# Patient Record
Sex: Male | Born: 1965 | Race: Black or African American | Hispanic: No | Marital: Married | Smoking: Never smoker
Health system: Southern US, Community
[De-identification: ages and names within clinical notes are randomized; demographics above are authoritative.]

## PROBLEM LIST (undated history)

## (undated) DIAGNOSIS — T7840XS Allergy, unspecified, sequela: Secondary | ICD-10-CM

## (undated) DIAGNOSIS — D721 Eosinophilia, unspecified: Secondary | ICD-10-CM

## (undated) DIAGNOSIS — I10 Essential (primary) hypertension: Secondary | ICD-10-CM

## (undated) DIAGNOSIS — K4090 Unilateral inguinal hernia, without obstruction or gangrene, not specified as recurrent: Secondary | ICD-10-CM

## (undated) HISTORY — DX: Unilateral inguinal hernia, without obstruction or gangrene, not specified as recurrent: K40.90

## (undated) HISTORY — PX: HERNIA REPAIR: SHX51

## (undated) HISTORY — DX: Allergy, unspecified, sequela: T78.40XS

## (undated) HISTORY — DX: Eosinophilia, unspecified: D72.10

---

## 1997-10-10 ENCOUNTER — Emergency Department (HOSPITAL_COMMUNITY): Admission: EM | Admit: 1997-10-10 | Discharge: 1997-10-10 | Payer: Self-pay | Admitting: Emergency Medicine

## 2006-03-03 ENCOUNTER — Emergency Department (HOSPITAL_COMMUNITY): Admission: EM | Admit: 2006-03-03 | Discharge: 2006-03-03 | Payer: Self-pay | Admitting: Emergency Medicine

## 2006-04-04 ENCOUNTER — Ambulatory Visit (HOSPITAL_COMMUNITY): Admission: RE | Admit: 2006-04-04 | Discharge: 2006-04-04 | Payer: Self-pay | Admitting: Internal Medicine

## 2016-04-20 ENCOUNTER — Emergency Department (HOSPITAL_COMMUNITY)
Admission: EM | Admit: 2016-04-20 | Discharge: 2016-04-20 | Disposition: A | Discharge status: Home / Self Care | Payer: 59 | Attending: Emergency Medicine | Admitting: Emergency Medicine

## 2016-04-20 ENCOUNTER — Encounter (HOSPITAL_COMMUNITY): Payer: Self-pay | Admitting: Emergency Medicine

## 2016-04-20 ENCOUNTER — Emergency Department (HOSPITAL_COMMUNITY): Payer: 59

## 2016-04-20 DIAGNOSIS — X58XXXA Exposure to other specified factors, initial encounter: Secondary | ICD-10-CM | POA: Diagnosis not present

## 2016-04-20 DIAGNOSIS — M545 Low back pain, unspecified: Secondary | ICD-10-CM

## 2016-04-20 DIAGNOSIS — R935 Abnormal findings on diagnostic imaging of other abdominal regions, including retroperitoneum: Secondary | ICD-10-CM | POA: Insufficient documentation

## 2016-04-20 DIAGNOSIS — Y9389 Activity, other specified: Secondary | ICD-10-CM | POA: Insufficient documentation

## 2016-04-20 DIAGNOSIS — I10 Essential (primary) hypertension: Secondary | ICD-10-CM | POA: Diagnosis not present

## 2016-04-20 DIAGNOSIS — R55 Syncope and collapse: Secondary | ICD-10-CM | POA: Diagnosis not present

## 2016-04-20 DIAGNOSIS — Z79899 Other long term (current) drug therapy: Secondary | ICD-10-CM | POA: Insufficient documentation

## 2016-04-20 DIAGNOSIS — S3992XA Unspecified injury of lower back, initial encounter: Secondary | ICD-10-CM | POA: Diagnosis present

## 2016-04-20 DIAGNOSIS — Z7982 Long term (current) use of aspirin: Secondary | ICD-10-CM | POA: Insufficient documentation

## 2016-04-20 DIAGNOSIS — Y92009 Unspecified place in unspecified non-institutional (private) residence as the place of occurrence of the external cause: Secondary | ICD-10-CM | POA: Insufficient documentation

## 2016-04-20 DIAGNOSIS — Y999 Unspecified external cause status: Secondary | ICD-10-CM | POA: Diagnosis not present

## 2016-04-20 DIAGNOSIS — S39012A Strain of muscle, fascia and tendon of lower back, initial encounter: Secondary | ICD-10-CM | POA: Diagnosis not present

## 2016-04-20 HISTORY — DX: Essential (primary) hypertension: I10

## 2016-04-20 LAB — URINALYSIS, ROUTINE W REFLEX MICROSCOPIC
Bilirubin Urine: NEGATIVE
Glucose, UA: NEGATIVE mg/dL
HGB URINE DIPSTICK: NEGATIVE
KETONES UR: NEGATIVE mg/dL
Leukocytes, UA: NEGATIVE
Nitrite: NEGATIVE
PROTEIN: NEGATIVE mg/dL
Specific Gravity, Urine: 1.018 (ref 1.005–1.030)
pH: 6 (ref 5.0–8.0)

## 2016-04-20 LAB — BASIC METABOLIC PANEL
Anion gap: 8 (ref 5–15)
BUN: 9 mg/dL (ref 6–20)
CALCIUM: 9.3 mg/dL (ref 8.9–10.3)
CO2: 28 mmol/L (ref 22–32)
CREATININE: 0.94 mg/dL (ref 0.61–1.24)
Chloride: 103 mmol/L (ref 101–111)
GFR calc Af Amer: >60 mL/min (ref >60)
GLUCOSE: 112 mg/dL — AB (ref 65–99)
POTASSIUM: 4.2 mmol/L (ref 3.5–5.1)
SODIUM: 139 mmol/L (ref 135–145)

## 2016-04-20 LAB — CBC
HEMATOCRIT: 43.8 % (ref 39.0–52.0)
Hemoglobin: 14.6 g/dL (ref 13.0–17.0)
MCH: 26.2 pg (ref 26.0–34.0)
MCHC: 33.3 g/dL (ref 30.0–36.0)
MCV: 78.6 fL (ref 78.0–100.0)
PLATELETS: 168 10*3/uL (ref 150–400)
RBC: 5.57 MIL/uL (ref 4.22–5.81)
RDW: 14.3 % (ref 11.5–15.5)
WBC: 14.5 10*3/uL — AB (ref 4.0–10.5)

## 2016-04-20 LAB — TROPONIN I

## 2016-04-20 LAB — I-STAT TROPONIN, ED: Troponin i, poc: 0 ng/mL (ref 0.00–0.08)

## 2016-04-20 MED ORDER — HYDROMORPHONE HCL 1 MG/ML IJ SOLN
1.0000 mg | Freq: Once | INTRAMUSCULAR | Status: AC
  Administered 2016-04-20: 1 mg via INTRAMUSCULAR
  Filled 2016-04-20: qty 1

## 2016-04-20 MED ORDER — HYDROCODONE-ACETAMINOPHEN 5-325 MG PO TABS: 2.0000 | ORAL_TABLET | ORAL | 0 refills | Status: DC | PRN

## 2016-04-20 MED ORDER — HYDROMORPHONE HCL 1 MG/ML IJ SOLN: 1.0000 mg | Freq: Once | INTRAMUSCULAR | Status: DC

## 2016-04-20 MED ORDER — DICLOFENAC SODIUM 75 MG PO TBEC: 75.0000 mg | DELAYED_RELEASE_TABLET | Freq: Two times a day (BID) | ORAL | 0 refills | Status: DC

## 2016-04-20 MED ORDER — CYCLOBENZAPRINE HCL 10 MG PO TABS: 10.0000 mg | ORAL_TABLET | Freq: Two times a day (BID) | ORAL | 0 refills | Status: DC | PRN

## 2016-04-20 MED ORDER — ONDANSETRON HCL 4 MG/2ML IJ SOLN: 4.0000 mg | Freq: Once | INTRAMUSCULAR | Status: DC

## 2016-04-20 NOTE — Discharge Instructions (Signed)
Return if any problems.  See Dr. Ouida Sills for recheck.

## 2016-04-20 NOTE — ED Notes (Signed)
Returned from ct scan 

## 2016-04-20 NOTE — ED Triage Notes (Signed)
Pt states yesterday while washing his car he started hav ing lower back pain and also had lower abd pain. This am pt was in bathroom and got very nauseous and having sereve pain and had syncope episode in bathroom. Wife walked into the bathroom and found him with his head lying on the commode was able to answer questions.

## 2016-04-20 NOTE — ED Notes (Signed)
Pt ambulated with slow steady gait unassisted to the restroom.

## 2016-04-20 NOTE — ED Provider Notes (Signed)
[facility]-EMERGENCY DEPT Provider Note   CSN: [id number] Arrival date & time: 04/20/16  0854     History   Chief Complaint Chief Complaint  Patient presents with  . Loss of Consciousness  . Back Pain    HPI Joseph Castro is a 51 y.o. male.  The history is provided by the patient. No language interpreter was used.  Loss of Consciousness   This is a new problem. The problem occurs constantly. He lost consciousness for a period of less than one minute. Associated with: urination. Associated symptoms include back pain. He has tried nothing for the symptoms. His past medical history is significant for HTN.  Back Pain    Pt reports he passed out last pm after urinating.  Pt reports he had severe pain in his right low back.  Pt reports he passed out last pm and hit his neck.  Pt reports he passed out again today.  Pt's wife reports he hit the back of his head.  She reports light impact only.    Past Medical History:  Diagnosis Date  . Hypertension     There are no active problems to display for this patient.   History reviewed. No pertinent surgical history.     Home Medications    Prior to Admission medications   Medication Sig Start Date End Date Taking? Authorizing Provider  amLODipine (NORVASC) 5 MG tablet Take 5 mg by mouth daily.   Yes Historical Provider, MD  Aspirin-Caffeine (BAYER BACK & BODY) 500-32.5 MG TABS Take 2 tablets by mouth daily as needed. pain   Yes Historical Provider, MD  losartan (COZAAR) 50 MG tablet Take 50 mg by mouth daily.   Yes Historical Provider, MD  Multiple Vitamins-Minerals (MULTIVITAMIN WITH MINERALS) tablet Take 1 tablet by mouth daily.   Yes Historical Provider, MD    Family History No family history on file.  Social History Social History  Substance Use Topics  . Smoking status: Not on file  . Smokeless tobacco: Not on file  . Alcohol use Not on file     Allergies   Patient has no known allergies.   Review of Systems Review  of Systems  Cardiovascular: Positive for syncope.  Musculoskeletal: Positive for back pain.  All other systems reviewed and are negative.    Physical Exam Updated Vital Signs BP 123/90 (BP Location: Right Arm)   Pulse (!) 102   Temp 98 F (36.7 C) (Oral)   Resp 16   SpO2 100%   Physical Exam  Constitutional: He appears well-developed and well-nourished.  HENT:  Head: Normocephalic and atraumatic.  Right Ear: External ear normal.  Left Ear: External ear normal.  Mouth/Throat: Oropharynx is clear and moist.  Eyes: Conjunctivae are normal.  Neck: Neck supple.  Cardiovascular: Normal rate and regular rhythm.   No murmur heard. Pulmonary/Chest: Effort normal and breath sounds normal. No respiratory distress.  Abdominal: Soft. There is no tenderness.  Musculoskeletal: He exhibits no edema.  Pain with movement,    Neurological: He is alert.  Skin: Skin is warm and dry.  Psychiatric: He has a normal mood and affect.  Nursing note and vitals reviewed.    ED Treatments / Results  Labs (all labs ordered are listed, but only abnormal results are displayed) Labs Reviewed  BASIC METABOLIC PANEL - Abnormal; Notable for the following:       Result Value   Glucose, Bld 112 (*)    All other components within normal limits  CBC -  Abnormal; Notable for the following:    WBC 14.5 (*)    All other components within normal limits  I-STAT TROPOININ, ED    EKG  EKG Interpretation None       Radiology No results found.  Procedures Procedures (including critical care time)  Medications Ordered in ED Medications - No data to display   Initial Impression / Assessment and Plan / ED Course  I have reviewed the triage vital signs and the nursing notes.  Pertinent labs & imaging results that were available during my care of the patient were reviewed by me and considered in my medical decision making (see chart for details).     Troponin negative x 2.  Ct scan no kidney  stone.  Pt given dilaudid and zofran IM. Dr. Dalene Seltzer in to see.  Pt advised to follow up with Dr. Ouida Sills.  Final Clinical Impressions(s) / ED Diagnoses   Final diagnoses:  Strain of lumbar region, initial encounter  Acute low back pain without sciatica, unspecified back pain laterality  Syncope and collapse    New Prescriptions New Prescriptions   CYCLOBENZAPRINE (FLEXERIL) 10 MG TABLET    Take 1 tablet (10 mg total) by mouth 2 (two) times daily as needed for muscle spasms.   DICLOFENAC (VOLTAREN) 75 MG EC TABLET    Take 1 tablet (75 mg total) by mouth 2 (two) times daily.   HYDROCODONE-ACETAMINOPHEN (NORCO/VICODIN) 5-325 MG TABLET    Take 2 tablets by mouth every 4 (four) hours as needed.     Lonia Skinner Williams, PA-C 04/20/16 1422    Alvira Monday, MD 04/22/16 [phone]

## 2016-05-04 DIAGNOSIS — M545 Low back pain: Secondary | ICD-10-CM | POA: Diagnosis not present

## 2016-05-04 DIAGNOSIS — R55 Syncope and collapse: Secondary | ICD-10-CM | POA: Diagnosis not present

## 2016-07-07 DIAGNOSIS — Z0001 Encounter for general adult medical examination with abnormal findings: Secondary | ICD-10-CM | POA: Diagnosis not present

## 2016-07-07 DIAGNOSIS — Z125 Encounter for screening for malignant neoplasm of prostate: Secondary | ICD-10-CM | POA: Diagnosis not present

## 2016-08-15 DIAGNOSIS — Z0001 Encounter for general adult medical examination with abnormal findings: Secondary | ICD-10-CM | POA: Diagnosis not present

## 2016-08-15 DIAGNOSIS — I10 Essential (primary) hypertension: Secondary | ICD-10-CM | POA: Diagnosis not present

## 2016-12-06 ENCOUNTER — Emergency Department (HOSPITAL_COMMUNITY)
Admission: EM | Admit: 2016-12-06 | Discharge: 2016-12-06 | Disposition: A | Discharge status: Home / Self Care | Payer: 59 | Attending: Emergency Medicine | Admitting: Emergency Medicine

## 2016-12-06 ENCOUNTER — Encounter (HOSPITAL_COMMUNITY): Payer: Self-pay

## 2016-12-06 ENCOUNTER — Other Ambulatory Visit: Payer: Self-pay

## 2016-12-06 ENCOUNTER — Emergency Department (HOSPITAL_COMMUNITY): Payer: 59

## 2016-12-06 DIAGNOSIS — M542 Cervicalgia: Secondary | ICD-10-CM | POA: Diagnosis not present

## 2016-12-06 DIAGNOSIS — Z79899 Other long term (current) drug therapy: Secondary | ICD-10-CM | POA: Diagnosis not present

## 2016-12-06 DIAGNOSIS — M549 Dorsalgia, unspecified: Secondary | ICD-10-CM | POA: Insufficient documentation

## 2016-12-06 DIAGNOSIS — I10 Essential (primary) hypertension: Secondary | ICD-10-CM | POA: Insufficient documentation

## 2016-12-06 DIAGNOSIS — R1013 Epigastric pain: Secondary | ICD-10-CM | POA: Insufficient documentation

## 2016-12-06 DIAGNOSIS — R0789 Other chest pain: Secondary | ICD-10-CM | POA: Diagnosis not present

## 2016-12-06 DIAGNOSIS — R079 Chest pain, unspecified: Secondary | ICD-10-CM | POA: Diagnosis not present

## 2016-12-06 LAB — BASIC METABOLIC PANEL
Anion gap: 8 (ref 5–15)
BUN: 13 mg/dL (ref 6–20)
CALCIUM: 9.2 mg/dL (ref 8.9–10.3)
CHLORIDE: 101 mmol/L (ref 101–111)
CO2: 28 mmol/L (ref 22–32)
CREATININE: 0.94 mg/dL (ref 0.61–1.24)
GFR calc non Af Amer: >60 mL/min (ref >60)
Glucose, Bld: 94 mg/dL (ref 65–99)
Potassium: 3.8 mmol/L (ref 3.5–5.1)
SODIUM: 137 mmol/L (ref 135–145)

## 2016-12-06 LAB — CBC
HEMATOCRIT: 42.1 % (ref 39.0–52.0)
HEMOGLOBIN: 14.1 g/dL (ref 13.0–17.0)
MCH: 26.7 pg (ref 26.0–34.0)
MCHC: 33.5 g/dL (ref 30.0–36.0)
MCV: 79.6 fL (ref 78.0–100.0)
Platelets: 186 10*3/uL (ref 150–400)
RBC: 5.29 MIL/uL (ref 4.22–5.81)
RDW: 14.2 % (ref 11.5–15.5)
WBC: 11 10*3/uL — ABNORMAL HIGH (ref 4.0–10.5)

## 2016-12-06 LAB — HEPATIC FUNCTION PANEL
ALBUMIN: 3.8 g/dL (ref 3.5–5.0)
ALK PHOS: 68 U/L (ref 38–126)
ALT: 18 U/L (ref 17–63)
AST: 18 U/L (ref 15–41)
Bilirubin, Direct: <0.1 mg/dL — ABNORMAL LOW (ref 0.1–0.5)
TOTAL PROTEIN: 7.2 g/dL (ref 6.5–8.1)
Total Bilirubin: 0.7 mg/dL (ref 0.3–1.2)

## 2016-12-06 LAB — I-STAT TROPONIN, ED
Troponin i, poc: 0.01 ng/mL (ref 0.00–0.08)
Troponin i, poc: 0 ng/mL (ref 0.00–0.08)

## 2016-12-06 LAB — LIPASE, BLOOD: LIPASE: 22 U/L (ref 11–51)

## 2016-12-06 MED ORDER — RANITIDINE HCL 150 MG PO TABS: 150.0000 mg | ORAL_TABLET | Freq: Two times a day (BID) | ORAL | 0 refills | Status: DC

## 2016-12-06 MED ORDER — METHOCARBAMOL 500 MG PO TABS: 500.0000 mg | ORAL_TABLET | Freq: Three times a day (TID) | ORAL | 0 refills | Status: DC | PRN

## 2016-12-06 MED ORDER — OMEPRAZOLE 20 MG PO CPDR: 20.0000 mg | DELAYED_RELEASE_CAPSULE | Freq: Every day | ORAL | 0 refills | Status: DC

## 2016-12-06 MED ORDER — GI COCKTAIL ~~LOC~~
30.0000 mL | Freq: Once | ORAL | Status: AC
Start: 2016-12-06 — End: 2016-12-06
  Administered 2016-12-06: 30 mL via ORAL
  Filled 2016-12-06: qty 30

## 2016-12-06 NOTE — ED Provider Notes (Signed)
[facility] EMERGENCY DEPARTMENT Provider Note   CSN: [id number] Arrival date & time: 12/06/16  1116     History   Chief Complaint Chief Complaint  Patient presents with  . Chest Pain    HPI Joseph Castro is a 51 y.o. male.  HPI   51 year old male with past medical history of hypertension here with atypical chest pain.  The patient states that for the last day and a half, he has developed an aching, pressure-like, but also burning epigastric and chest pain.  This radiates towards his neck as well as his bilateral back and flanks.  The pain is worse after eating and acutely was made more severe after eating today, so he presents for evaluation.  Denies any diaphoresis or shortness of breath.  He has no history of coronary disease.  No significant family history of early coronary disease.  No other medical complaints.  No cough, shortness of breath, or leg swelling.  No history of PEs.  Denies any known history of ulcer disease.  He does note that he is under significantly increased stress at work recently.  Past Medical History:  Diagnosis Date  . Hypertension     There are no active problems to display for this patient.   History reviewed. No pertinent surgical history.     Home Medications    Prior to Admission medications   Medication Sig Start Date End Date Taking? Authorizing Provider  amLODipine (NORVASC) 5 MG tablet Take 5 mg by mouth daily.   Yes [provider]  cetirizine (ZYRTEC) 10 MG tablet Take 10 mg by mouth daily.   Yes [provider]  losartan (COZAAR) 50 MG tablet Take 50 mg by mouth daily.   Yes [provider]  Multiple Vitamins-Minerals (MULTIVITAMIN WITH MINERALS) tablet Take 1 tablet by mouth daily.   Yes [provider]  cyclobenzaprine (FLEXERIL) 10 MG tablet Take 1 tablet (10 mg total) by mouth 2 (two) times daily as needed for muscle spasms. Patient not taking: Reported on 12/06/2016 04/20/16    Elson Areas, PA-C  diclofenac (VOLTAREN) 75 MG EC tablet Take 1 tablet (75 mg total) by mouth 2 (two) times daily. Patient not taking: Reported on 12/06/2016 04/20/16   Elson Areas, PA-C  HYDROcodone-acetaminophen (NORCO/VICODIN) 5-325 MG tablet Take 2 tablets by mouth every 4 (four) hours as needed. Patient not taking: Reported on 12/06/2016 04/20/16   Elson Areas, PA-C  methocarbamol (ROBAXIN) 500 MG tablet Take 1 tablet (500 mg total) by mouth every 8 (eight) hours as needed for muscle spasms. 12/06/16   Shaune Pollack, MD  omeprazole (PRILOSEC) 20 MG capsule Take 1 capsule (20 mg total) by mouth daily. 12/06/16   Shaune Pollack, MD  ranitidine (ZANTAC) 150 MG tablet Take 1 tablet (150 mg total) by mouth 2 (two) times daily for 7 days. 12/06/16 12/13/16  Shaune Pollack, MD    Family History History reviewed. No pertinent family history.  Social History Social History   Tobacco Use  . Smoking status: Never Smoker  . Smokeless tobacco: Never Used  Substance Use Topics  . Alcohol use: No    Frequency: Never  . Drug use: Not on file     Allergies   Patient has no known allergies.   Review of Systems Review of Systems  Respiratory: Positive for chest tightness and shortness of breath.   Cardiovascular: Positive for chest pain.  Gastrointestinal: Positive for abdominal pain and nausea.  All other systems  reviewed and are negative.    Physical Exam Updated Vital Signs BP (!) 139/94   Pulse 86   Resp 20   Ht 5\' 11"  (1.803 m)   Wt 90.7 kg (200 lb)   SpO2 100%   BMI 27.89 kg/m   Physical Exam  Constitutional: He is oriented to person, place, and time. He appears well-developed and well-nourished. No distress.  HENT:  Head: Normocephalic and atraumatic.  Eyes: Conjunctivae are normal.  Neck: Neck supple.  Cardiovascular: Normal rate, regular rhythm and normal heart sounds. Exam reveals no friction rub.  No murmur heard. Pulmonary/Chest: Effort normal and  breath sounds normal. No respiratory distress. He has no wheezes. He has no rales.  Abdominal: He exhibits no distension.  Mild epigastric tenderness.  No rebound or guarding.  Negative Murphy's.  Musculoskeletal: He exhibits no edema.  No lower extremity edema or swelling.  No significant paraspinal or spinal tenderness to palpation.  Neurological: He is alert and oriented to person, place, and time. He exhibits normal muscle tone.  Skin: Skin is warm. Capillary refill takes less than 2 seconds.  Psychiatric: He has a normal mood and affect.  Nursing note and vitals reviewed.    ED Treatments / Results  Labs (all labs ordered are listed, but only abnormal results are displayed) Labs Reviewed  CBC - Abnormal; Notable for the following components:      Result Value   WBC 11.0 (*)    All other components within normal limits  HEPATIC FUNCTION PANEL - Abnormal; Notable for the following components:   Bilirubin, Direct <0.1 (*)    All other components within normal limits  BASIC METABOLIC PANEL  LIPASE, BLOOD  I-STAT TROPONIN, ED  I-STAT TROPONIN, ED    EKG  EKG Interpretation  Date/Time:  Wednesday December 06 2016 11:26:26 EST Ventricular Rate:  93 PR Interval:  164 QRS Duration: 84 QT Interval:  348 QTC Calculation: 432 R Axis:   77 Text Interpretation:  Normal sinus rhythm Normal ECG No significant change since last tracing Confirmed by Shaune PollackIsaacs, Latysha Thackston [phone]([id number]) on 12/06/2016 2:15:39 PM       Radiology Dg Chest 2 View  Result Date: 12/06/2016 CLINICAL DATA:  Chest pain EXAM: CHEST  2 VIEW COMPARISON:  March 03, 2006 FINDINGS: Lungs are clear. Heart size and pulmonary vascularity are normal. No adenopathy. No pneumothorax. There is mild degenerative change in the thoracic spine. IMPRESSION: No edema or consolidation. Electronically Signed   By: Bretta BangWilliam  Woodruff III M.D.   On: 12/06/2016 12:18    Procedures Procedures (including critical care time)  Medications  Ordered in ED Medications  gi cocktail (Maalox,Lidocaine,Donnatal) (30 mLs Oral Given 12/06/16 1516)     Initial Impression / Assessment and Plan / ED Course  I have reviewed the triage vital signs and the nursing notes.  Pertinent labs & imaging results that were available during my care of the patient were reviewed by me and considered in my medical decision making (see chart for details).     51 yo M here with atypical chest pain. Suspect stress gastritis versus MSk chest wall pain/back pain. EKG non-ischemic, delta trop negative with HEART score <3 - doubt ACS. He has no tachycardia, tachypnea, hypoxia, or s/s to suggest DVT or PE. CXR is clear. LFTs, lipase normal and he ahs no RUQ TTP. Sx improved with GI cocktail. Will treat with antacids, supportive care, and d/c with good return precautions.  Final Clinical Impressions(s) / ED Diagnoses   Final  diagnoses:  Atypical chest pain    ED Discharge Orders        Ordered    omeprazole (PRILOSEC) 20 MG capsule  Daily     12/06/16 1619    ranitidine (ZANTAC) 150 MG tablet  2 times daily     12/06/16 1619    methocarbamol (ROBAXIN) 500 MG tablet  Every 8 hours PRN     12/06/16 1619       Shaune PollackIsaacs, Britlee Skolnik, MD 12/06/16 1705

## 2016-12-06 NOTE — ED Triage Notes (Signed)
Pt reports left side chest pain that began around 0530. He reports mild shortness of breath. Pt denies recent cough or any other associated symptoms. Skin warm and dry. No distress noted.

## 2016-12-06 NOTE — ED Notes (Signed)
Pt stable, ambulatory, states understanding of discharge instructions 

## 2017-02-13 DIAGNOSIS — I10 Essential (primary) hypertension: Secondary | ICD-10-CM | POA: Diagnosis not present

## 2017-02-13 DIAGNOSIS — Z6829 Body mass index (BMI) 29.0-29.9, adult: Secondary | ICD-10-CM | POA: Diagnosis not present

## 2017-04-30 ENCOUNTER — Other Ambulatory Visit (HOSPITAL_COMMUNITY): Payer: Self-pay | Admitting: Internal Medicine

## 2017-04-30 ENCOUNTER — Ambulatory Visit (HOSPITAL_COMMUNITY)
Admission: RE | Admit: 2017-04-30 | Discharge: 2017-04-30 | Disposition: A | Discharge status: Home / Self Care | Payer: 59 | Source: Ambulatory Visit | Attending: Internal Medicine | Admitting: Internal Medicine

## 2017-04-30 DIAGNOSIS — J984 Other disorders of lung: Secondary | ICD-10-CM | POA: Diagnosis not present

## 2017-04-30 DIAGNOSIS — J90 Pleural effusion, not elsewhere classified: Secondary | ICD-10-CM | POA: Insufficient documentation

## 2017-04-30 DIAGNOSIS — R079 Chest pain, unspecified: Secondary | ICD-10-CM

## 2017-05-01 ENCOUNTER — Other Ambulatory Visit (HOSPITAL_COMMUNITY): Payer: Self-pay | Admitting: Internal Medicine

## 2017-05-01 DIAGNOSIS — R911 Solitary pulmonary nodule: Secondary | ICD-10-CM

## 2017-05-02 ENCOUNTER — Ambulatory Visit (HOSPITAL_COMMUNITY)
Admission: RE | Admit: 2017-05-02 | Discharge: 2017-05-02 | Disposition: A | Discharge status: Home / Self Care | Payer: 59 | Source: Ambulatory Visit | Attending: Internal Medicine | Admitting: Internal Medicine

## 2017-05-02 DIAGNOSIS — J189 Pneumonia, unspecified organism: Secondary | ICD-10-CM | POA: Diagnosis not present

## 2017-05-02 DIAGNOSIS — J90 Pleural effusion, not elsewhere classified: Secondary | ICD-10-CM | POA: Diagnosis not present

## 2017-05-02 DIAGNOSIS — R911 Solitary pulmonary nodule: Secondary | ICD-10-CM | POA: Insufficient documentation

## 2017-05-02 DIAGNOSIS — R590 Localized enlarged lymph nodes: Secondary | ICD-10-CM | POA: Insufficient documentation

## 2017-05-02 LAB — POCT I-STAT CREATININE: CREATININE: 0.9 mg/dL (ref 0.61–1.24)

## 2017-05-02 MED ORDER — IOPAMIDOL (ISOVUE-300) INJECTION 61%
75.0000 mL | Freq: Once | INTRAVENOUS | Status: AC | PRN
  Administered 2017-05-02: 75 mL via INTRAVENOUS

## 2017-07-30 DIAGNOSIS — M545 Low back pain: Secondary | ICD-10-CM | POA: Diagnosis not present

## 2017-07-30 DIAGNOSIS — M9903 Segmental and somatic dysfunction of lumbar region: Secondary | ICD-10-CM | POA: Diagnosis not present

## 2017-07-30 DIAGNOSIS — M9904 Segmental and somatic dysfunction of sacral region: Secondary | ICD-10-CM | POA: Diagnosis not present

## 2017-07-31 DIAGNOSIS — M9903 Segmental and somatic dysfunction of lumbar region: Secondary | ICD-10-CM | POA: Diagnosis not present

## 2017-07-31 DIAGNOSIS — M545 Low back pain: Secondary | ICD-10-CM | POA: Diagnosis not present

## 2017-07-31 DIAGNOSIS — M9904 Segmental and somatic dysfunction of sacral region: Secondary | ICD-10-CM | POA: Diagnosis not present

## 2017-08-01 DIAGNOSIS — M545 Low back pain: Secondary | ICD-10-CM | POA: Diagnosis not present

## 2017-08-01 DIAGNOSIS — M5416 Radiculopathy, lumbar region: Secondary | ICD-10-CM | POA: Diagnosis not present

## 2017-08-02 ENCOUNTER — Other Ambulatory Visit (HOSPITAL_COMMUNITY): Payer: Self-pay | Admitting: Internal Medicine

## 2017-08-02 DIAGNOSIS — R911 Solitary pulmonary nodule: Secondary | ICD-10-CM

## 2017-08-20 ENCOUNTER — Ambulatory Visit (HOSPITAL_COMMUNITY)
Admission: RE | Admit: 2017-08-20 | Discharge: 2017-08-20 | Disposition: A | Discharge status: Home / Self Care | Payer: 59 | Source: Ambulatory Visit | Attending: Internal Medicine | Admitting: Internal Medicine

## 2017-08-20 DIAGNOSIS — R911 Solitary pulmonary nodule: Secondary | ICD-10-CM | POA: Diagnosis not present

## 2017-08-20 DIAGNOSIS — R918 Other nonspecific abnormal finding of lung field: Secondary | ICD-10-CM | POA: Diagnosis not present

## 2017-08-20 MED ORDER — IOHEXOL 300 MG/ML  SOLN
75.0000 mL | Freq: Once | INTRAMUSCULAR | Status: AC | PRN
  Administered 2017-08-20: 75 mL via INTRAVENOUS

## 2017-10-23 DIAGNOSIS — I10 Essential (primary) hypertension: Secondary | ICD-10-CM | POA: Diagnosis not present

## 2017-10-23 DIAGNOSIS — Z79899 Other long term (current) drug therapy: Secondary | ICD-10-CM | POA: Diagnosis not present

## 2017-10-23 DIAGNOSIS — Z125 Encounter for screening for malignant neoplasm of prostate: Secondary | ICD-10-CM | POA: Diagnosis not present

## 2017-10-30 DIAGNOSIS — Z0001 Encounter for general adult medical examination with abnormal findings: Secondary | ICD-10-CM | POA: Diagnosis not present

## 2017-10-30 DIAGNOSIS — T7840XA Allergy, unspecified, initial encounter: Secondary | ICD-10-CM | POA: Diagnosis not present

## 2017-10-30 DIAGNOSIS — I10 Essential (primary) hypertension: Secondary | ICD-10-CM | POA: Diagnosis not present

## 2018-02-26 ENCOUNTER — Other Ambulatory Visit (HOSPITAL_COMMUNITY): Payer: Self-pay | Admitting: Internal Medicine

## 2018-02-26 DIAGNOSIS — R1032 Left lower quadrant pain: Principal | ICD-10-CM

## 2018-02-26 DIAGNOSIS — R1031 Right lower quadrant pain: Secondary | ICD-10-CM

## 2018-02-27 ENCOUNTER — Other Ambulatory Visit (HOSPITAL_COMMUNITY): Payer: Self-pay | Admitting: Internal Medicine

## 2018-02-27 DIAGNOSIS — R1032 Left lower quadrant pain: Principal | ICD-10-CM

## 2018-02-27 DIAGNOSIS — R1031 Right lower quadrant pain: Secondary | ICD-10-CM

## 2018-03-04 ENCOUNTER — Ambulatory Visit (HOSPITAL_COMMUNITY)
Admission: RE | Admit: 2018-03-04 | Discharge: 2018-03-04 | Disposition: A | Discharge status: Home / Self Care | Payer: 59 | Source: Ambulatory Visit | Attending: Internal Medicine | Admitting: Internal Medicine

## 2018-03-04 DIAGNOSIS — R1032 Left lower quadrant pain: Secondary | ICD-10-CM | POA: Insufficient documentation

## 2018-03-04 DIAGNOSIS — R194 Change in bowel habit: Secondary | ICD-10-CM | POA: Diagnosis not present

## 2018-03-04 DIAGNOSIS — R1031 Right lower quadrant pain: Secondary | ICD-10-CM

## 2018-06-07 DIAGNOSIS — I10 Essential (primary) hypertension: Secondary | ICD-10-CM | POA: Diagnosis not present

## 2018-11-03 IMAGING — DX DG CHEST 2V
2 series · 2 of 2 positions shown · non-contrast
Comparison: March 03, 2006

CLINICAL DATA: Chest pain

EXAM:
CHEST  2 VIEW

[chest pa]
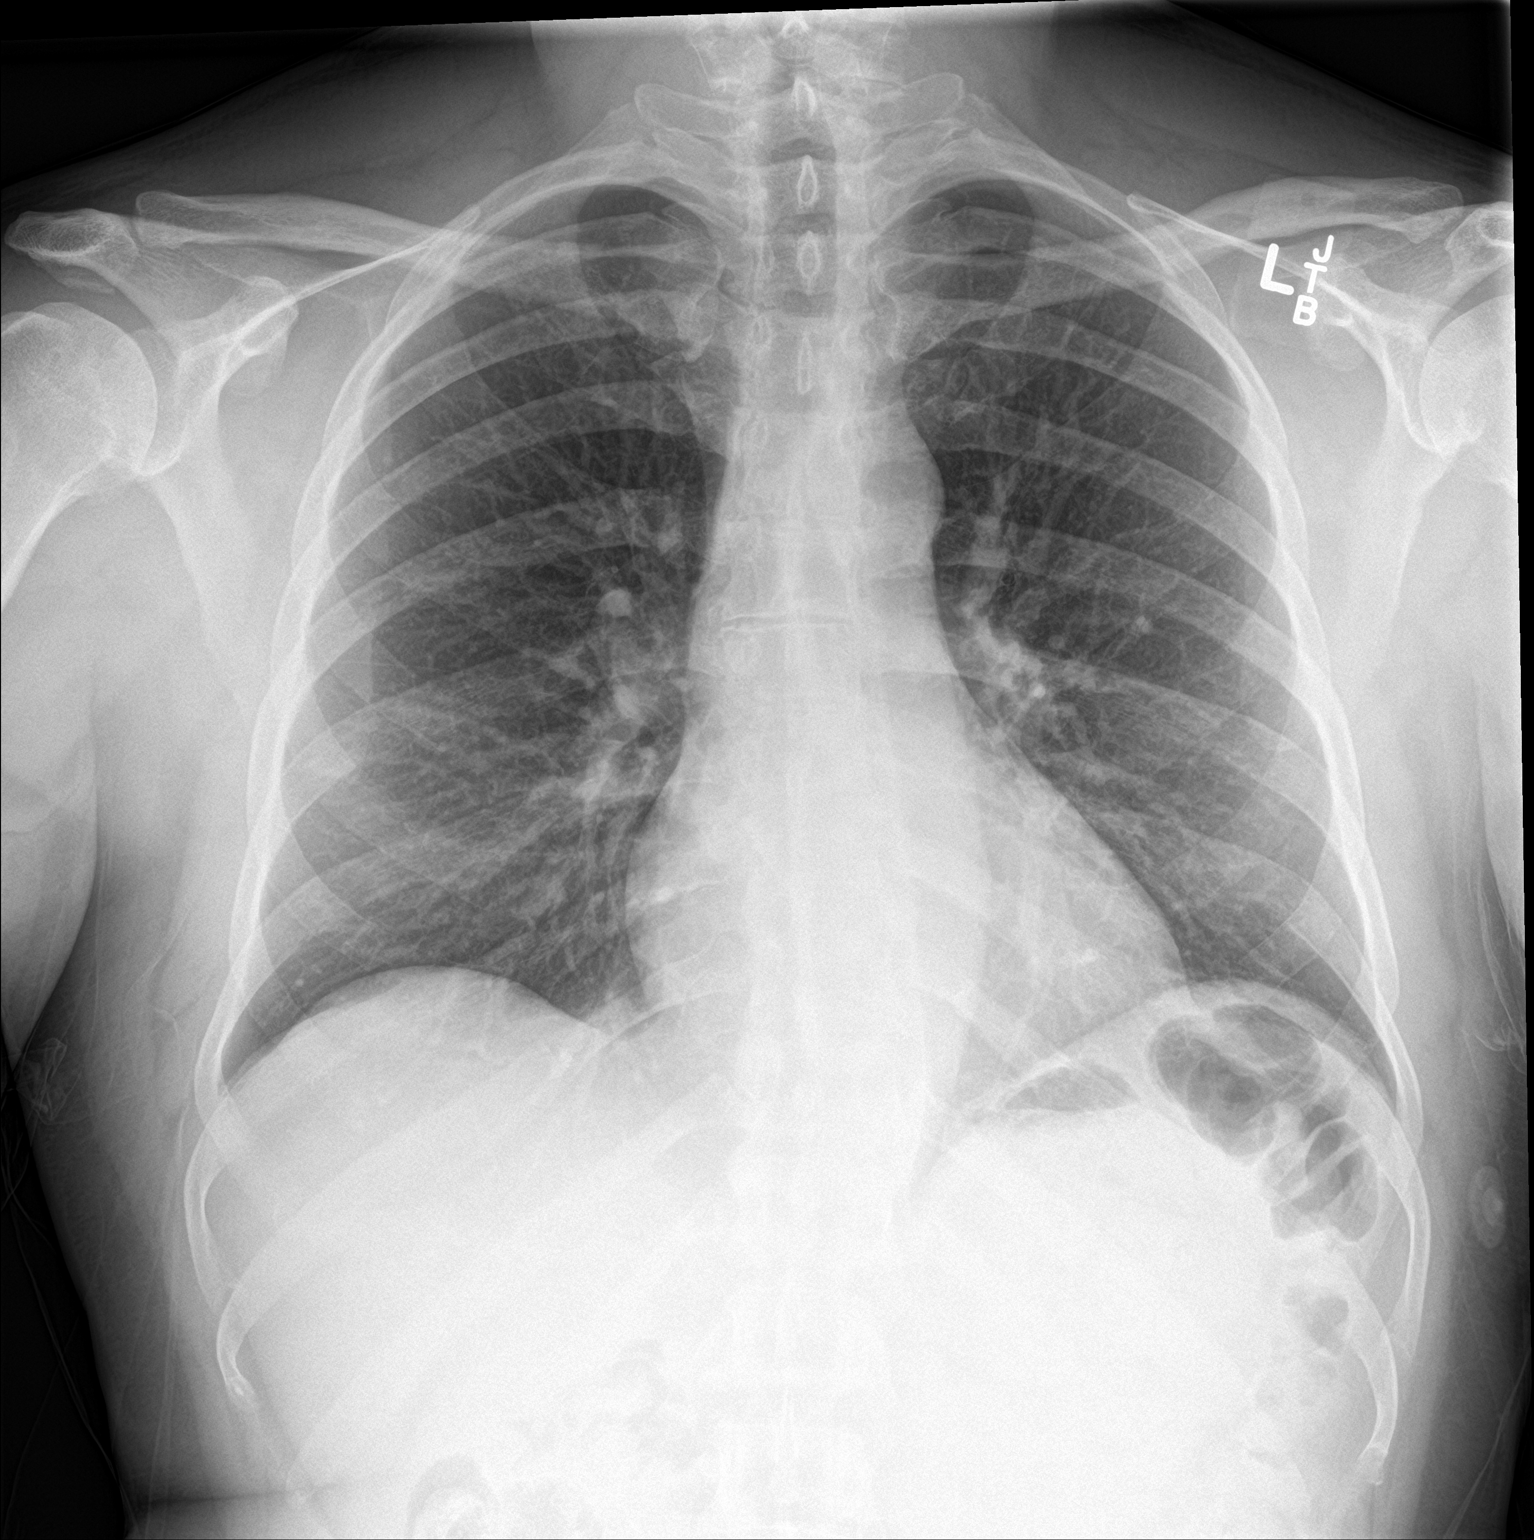

[chest lat]
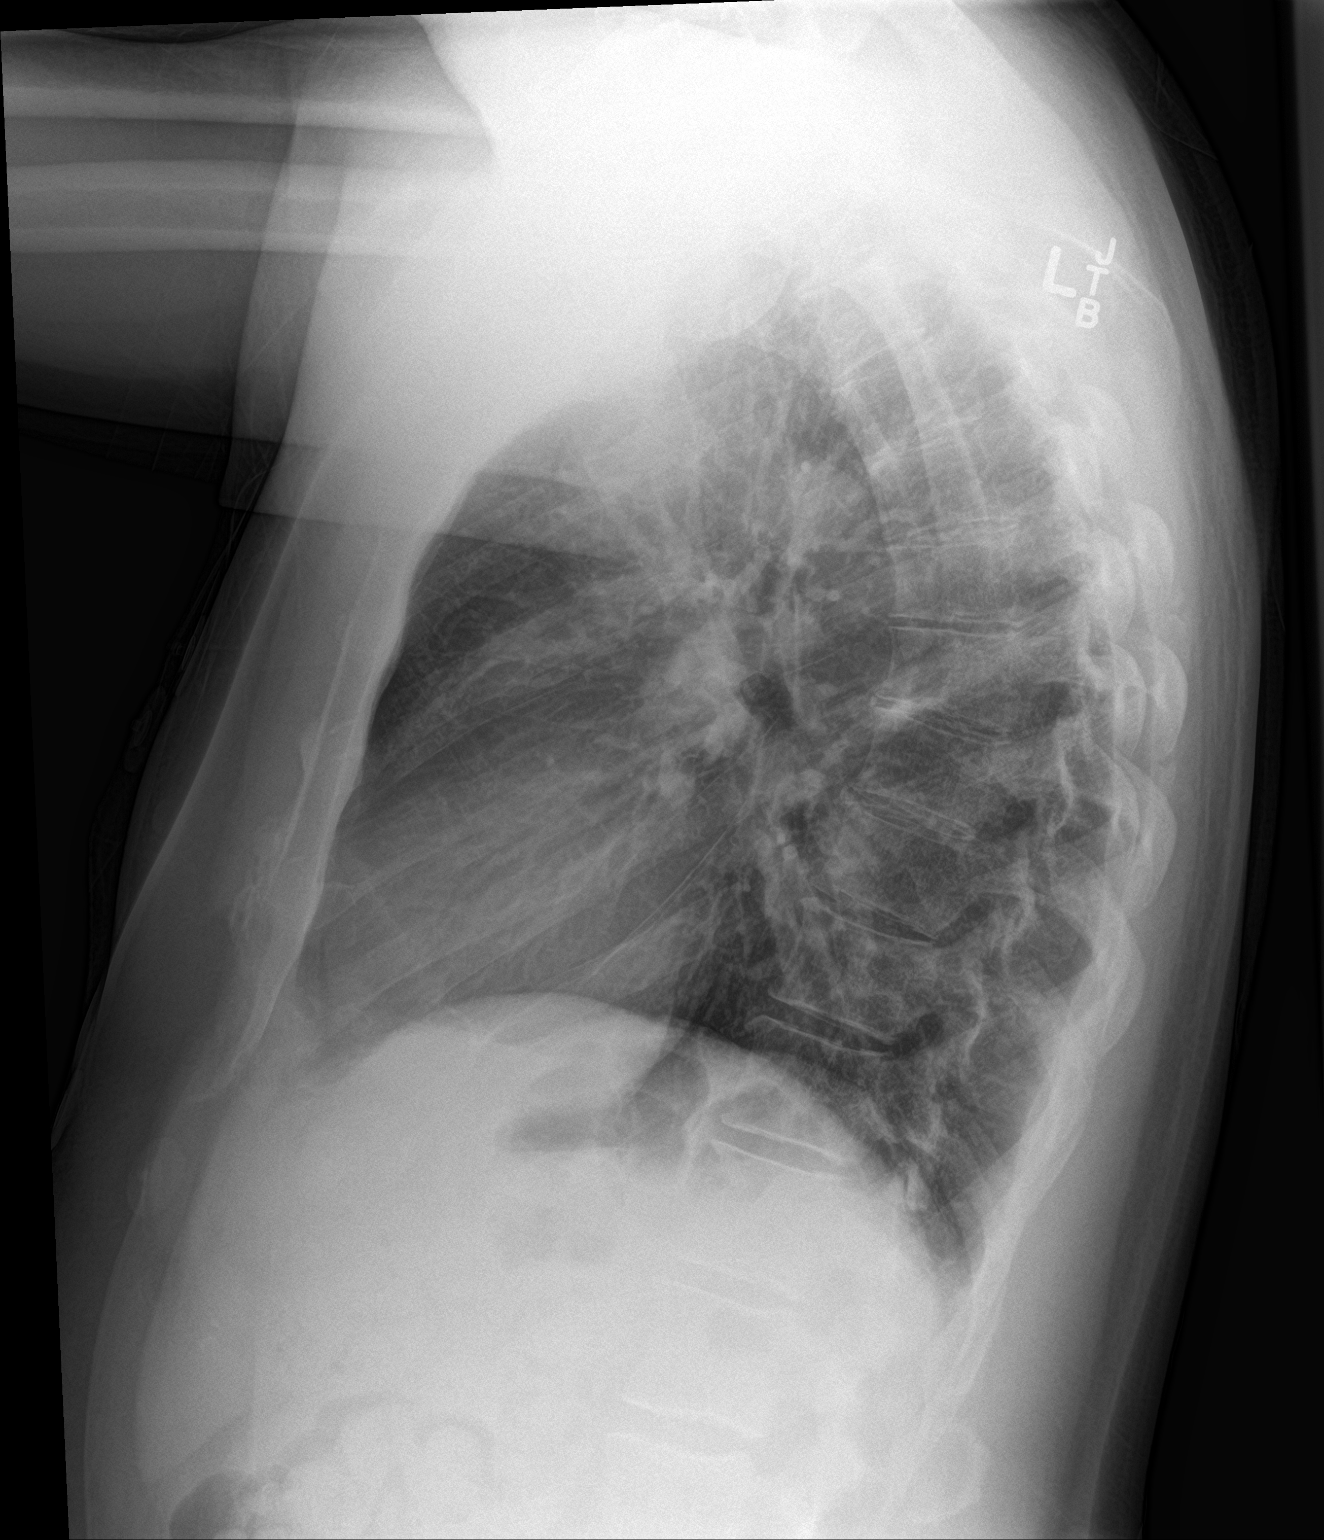

[2 of 2 positions shown; findings below may reference images not displayed]

FINDINGS: Lungs are clear. Heart size and pulmonary vascularity are normal. No
adenopathy. No pneumothorax. There is mild degenerative change in
the thoracic spine.
IMPRESSION: No edema or consolidation.

## 2019-02-28 ENCOUNTER — Encounter: Payer: Self-pay | Admitting: Internal Medicine

## 2019-03-25 ENCOUNTER — Ambulatory Visit: Payer: 59 | Admitting: Internal Medicine

## 2019-03-25 ENCOUNTER — Other Ambulatory Visit: Payer: Self-pay

## 2019-03-25 ENCOUNTER — Encounter: Payer: Self-pay | Admitting: Internal Medicine

## 2019-03-25 VITALS — BP 150/90 | HR 95 | Temp 97.2°F | Ht 71.0 in | Wt 199.0 lb

## 2019-03-25 DIAGNOSIS — R109 Unspecified abdominal pain: Secondary | ICD-10-CM

## 2019-03-25 DIAGNOSIS — K625 Hemorrhage of anus and rectum: Secondary | ICD-10-CM | POA: Diagnosis not present

## 2019-03-25 DIAGNOSIS — K602 Anal fissure, unspecified: Secondary | ICD-10-CM | POA: Diagnosis not present

## 2019-03-25 NOTE — Patient Instructions (Signed)
Take Metamucil daily  Please follow up as needed

## 2019-03-25 NOTE — Progress Notes (Signed)
HISTORY OF PRESENT ILLNESS:  Joseph Castro is a 54 y.o. male who sent today by his primary care provider regarding rectal bleeding.  Patient did undergo complete colonoscopy August 2017 with Dr. Dorena Cookey.  This was normal.  He tells me that he was in his usual state of health until January 2021 when he noticed occasional blood when wiping after defecation.  Only on the tissue.  Associated with that was a burning discomfort in the anal canal.  He self medicated with topical Vaseline.  Problem has resolved after 2 weeks.  Has had no problems since.  He wants to be assured that this is nothing serious.  His GI review of systems is otherwise negative except for reports of remote vague transient abdominal discomfort.  None recently.  REVIEW OF SYSTEMS:  All non-GI ROS negative unless otherwise stated in the HPI except for sinus and allergy trouble, back pain  Past Medical History:  Diagnosis Date  . Allergic response, sequela   . Eosinophilia   . Hypertension   . Right inguinal hernia     Past Surgical History:  Procedure Laterality Date  . HERNIA REPAIR      Social History [name]  reports that he has never smoked. He has never used smokeless tobacco. He reports that he does not drink alcohol. No history on file for drug.  family history includes Diabetes in his father; Hypertension in his mother; Rheum arthritis in his mother.  No Known Allergies     PHYSICAL EXAMINATION: Vital signs: BP (!) 150/90   Pulse 95   Temp (!) 97.2 F (36.2 C)   Ht 5\' 11"  (1.803 m)   Wt 199 lb (90.3 kg)   BMI 27.75 kg/m   Constitutional: generally well-appearing, no acute distress Psychiatric: alert and oriented x3, cooperative Eyes: extraocular movements intact, anicteric, conjunctiva pink Mouth: oral pharynx moist, no lesions Neck: supple no lymphadenopathy Cardiovascular: heart regular rate and rhythm, no murmur Lungs: clear to auscultation bilaterally Abdomen: soft, nontender,  nondistended, no obvious ascites, no peritoneal signs, normal bowel sounds, no organomegaly Rectal: No external abnormalities.  Small almost healed posterior fissure.  Hemoccult negative stool Extremities: no clubbing, cyanosis, or lower extremity edema bilaterally Skin: no lesions on visible extremities Neuro: No focal deficits.  Cranial nerves intact  ASSESSMENT:  1.  Recent problems with minor rectal bleeding and anal burning secondary to fissure.  Essentially healed 2.  Colonoscopy 2017 with Dr. 2018.  Normal 3.  Complaints of vague transient remote abdominal discomfort.  Resolved.   PLAN:   1.  Discussed anal fissure with the patient 2.  Recommend a daily fiber supplementation with Metamucil 3.  Reassurance regarding vague transient abdominal discomfort without alarm features 4.  Would be due for routine repeat screening colonoscopy around 2027 5.  Return to the general medical care of Dr. 2028.  GI follow-up as needed

## 2019-03-27 ENCOUNTER — Ambulatory Visit: Payer: 59 | Attending: Internal Medicine

## 2019-03-27 DIAGNOSIS — Z23 Encounter for immunization: Secondary | ICD-10-CM

## 2019-03-27 NOTE — Progress Notes (Signed)
   Covid-19 Vaccination Clinic  Name:  Joseph Castro    MRN: [id number] DOB: 28-Feb-1965  03/27/2019  [name] was observed post Covid-19 immunization for 15 minutes without incident. He was provided with Vaccine Information Sheet and instruction to access the V-Safe system.   [name] was instructed to call 911 with any severe reactions post vaccine: Marland Kitchen Difficulty breathing  . Swelling of face and throat  . A fast heartbeat  . A bad rash all over body  . Dizziness and weakness   Immunizations Administered    Name Date Dose VIS Date Route   Pfizer COVID-19 Vaccine 03/27/2019  4:29 PM 0.3 mL 12/27/2018 Intramuscular   Manufacturer: [employer], [employer]   Lot: [id number]   NDC: [address]-8

## 2019-03-30 IMAGING — CT CT CHEST W/ CM
2 of 3 series · 15 of 36 positions shown, 18 images · IV contrast (iopamidol)
Comparison: 04/30/2017 chest radiograph.

CLINICAL DATA: Possible left pulmonary nodule on recent chest
radiograph.

EXAM:
CT CHEST WITH CONTRAST
TECHNIQUE: Multidetector CT imaging of the chest was performed during
intravenous contrast administration.
CONTRAST:  75mL UR0LV8-AQQ IOPAMIDOL (UR0LV8-AQQ) INJECTION 61%

[Series 2: axial st · axial · 0.67mm/px · z∈[-441,-171]mm · 12 of 159 slices shown, 15 images]
[im 12/159  mediastinal]
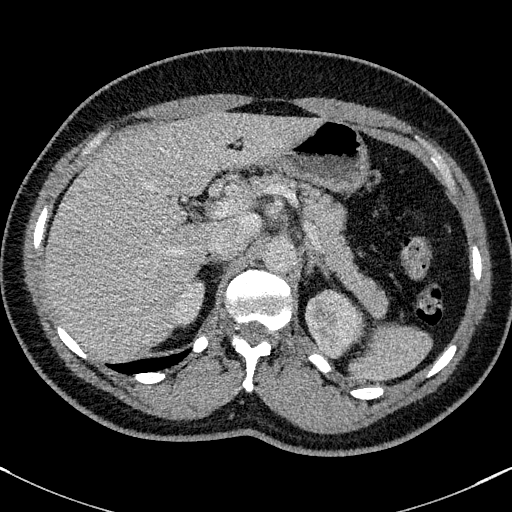
[im 12/159  lung]
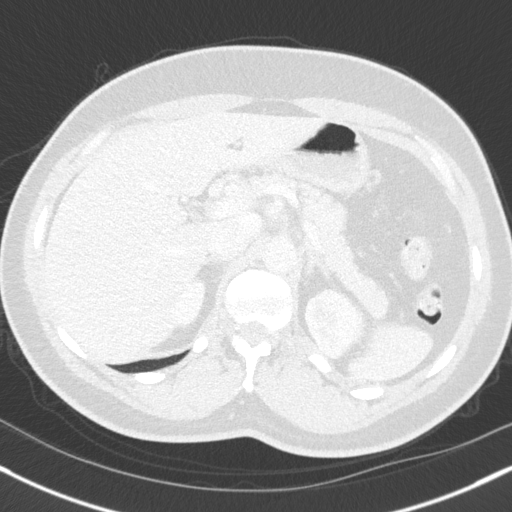
[im 24/159  lung]
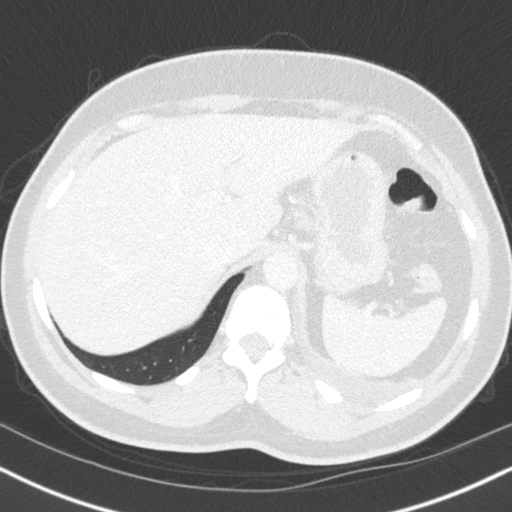
[im 36/159  lung]
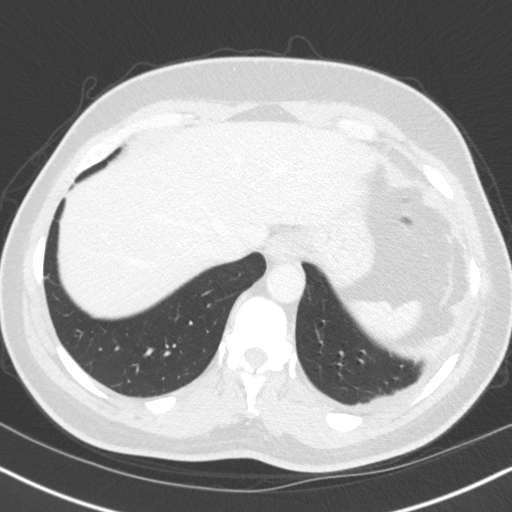
[im 47/159  lung]
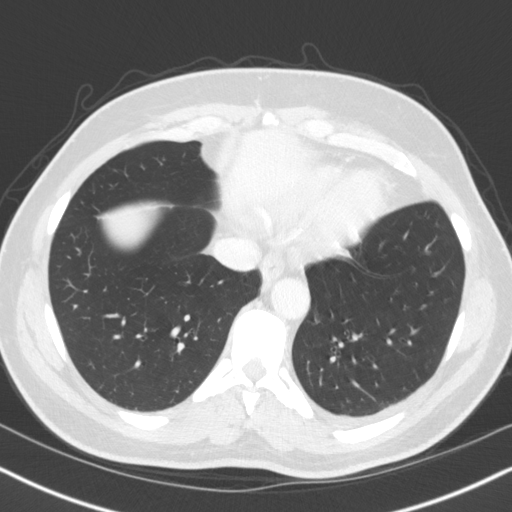
[im 59/159  mediastinal]
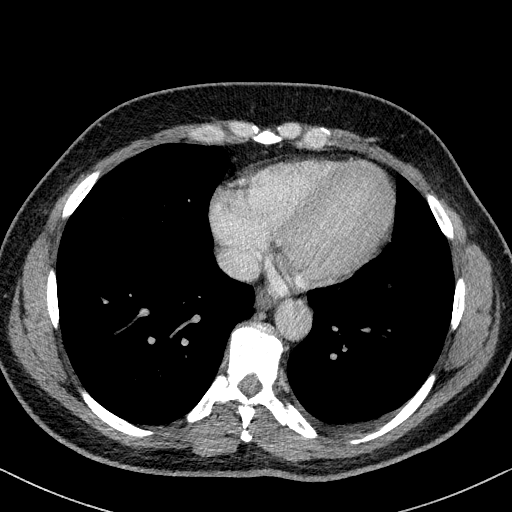
[im 59/159  lung]
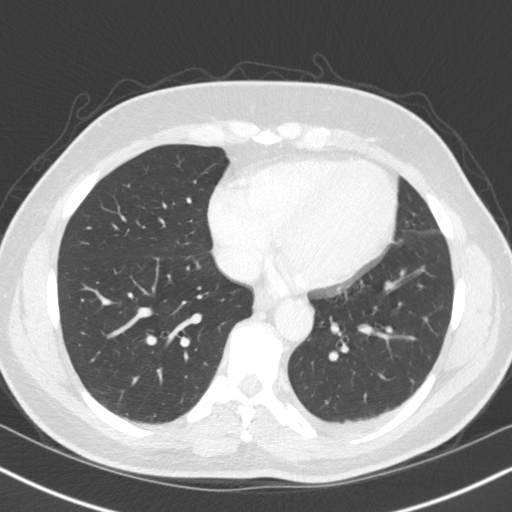
[im 71/159  lung]
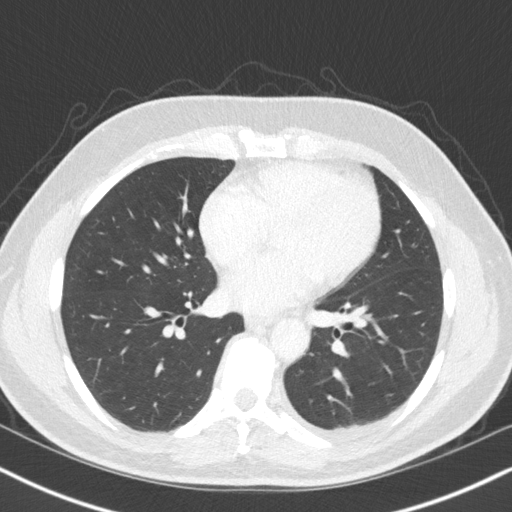
[im 88/159  lung]
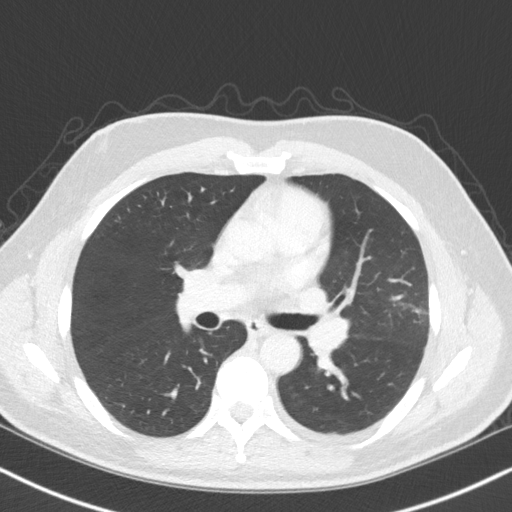
[im 100/159  lung]
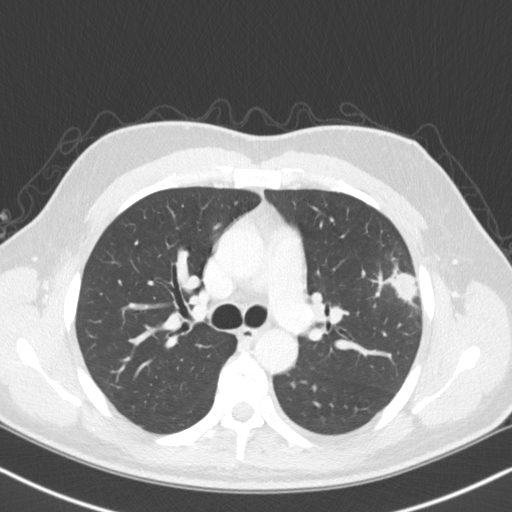
[im 112/159  mediastinal]
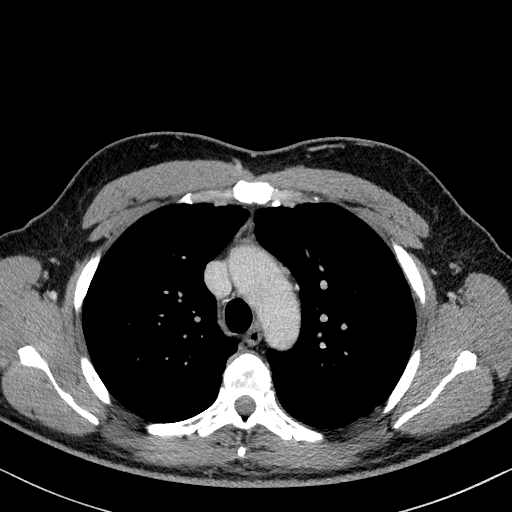
[im 112/159  lung]
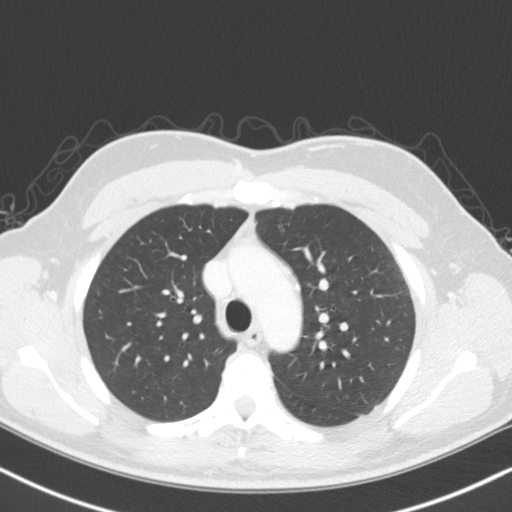
[im 123/159  lung]
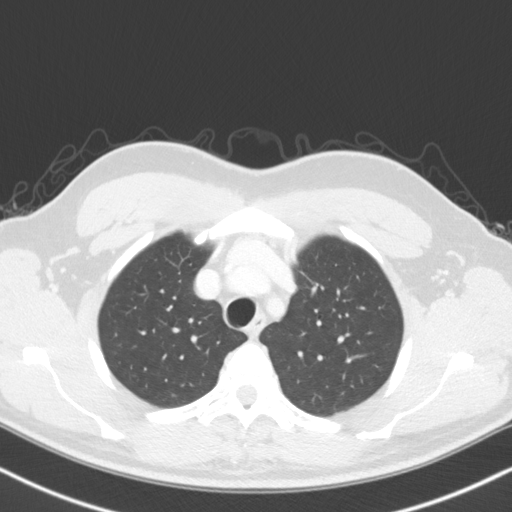
[im 135/159  lung]
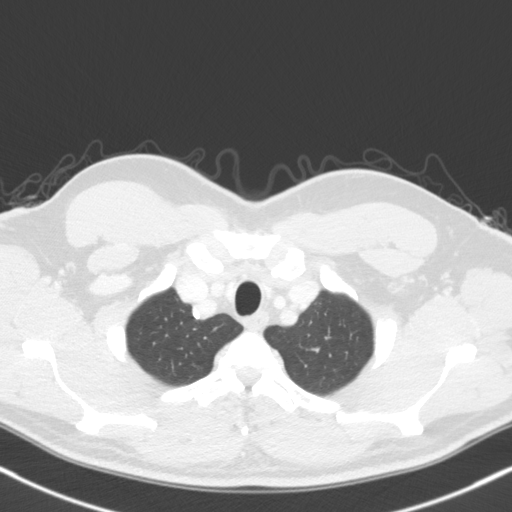
[im 147/159  lung]
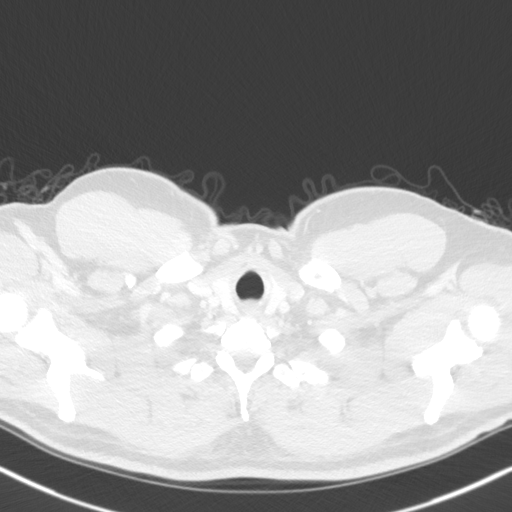

[Series 5: coronal · coronal · 0.66mm/px · 3 of 151 slices shown]
[im 31/151  lung]
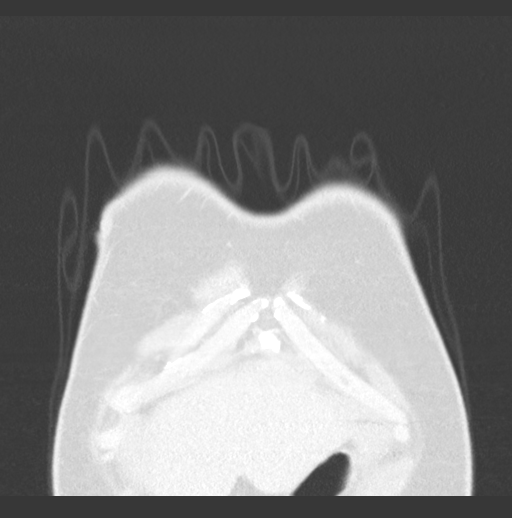
[im 61/151  lung]
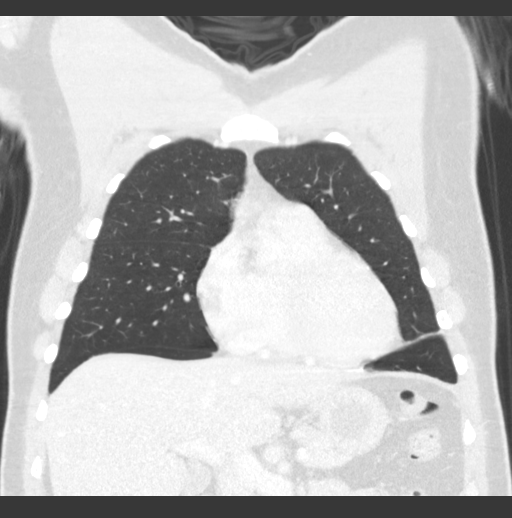
[im 91/151  lung]
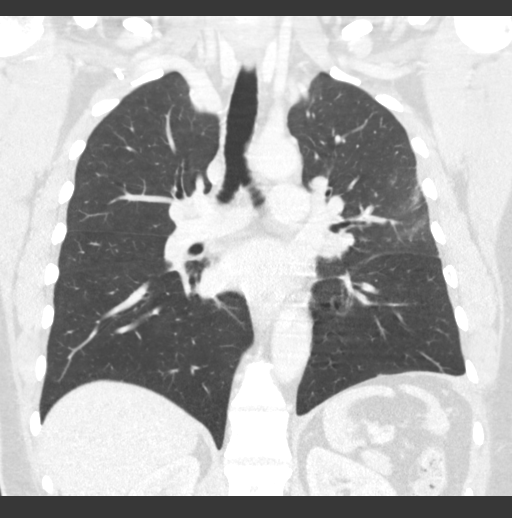

[15 of 36 positions shown; findings below may reference images not displayed]

FINDINGS: Cardiovascular: Normal heart size. No significant pericardial
fluid/thickening. Great vessels are normal in course and caliber. No
central pulmonary emboli.

Mediastinum/Nodes: No discrete thyroid nodules. Unremarkable
esophagus. No axillary adenopathy. No mediastinal or right hilar
adenopathy. Enlarged 1.2 cm left hilar node (series 2/image 78).

Lungs/Pleura: No pneumothorax. Trace dependent left pleural
effusion. No right pleural effusion. There is a solid 2.3 x 1.6 cm
peripheral left upper lobe pulmonary nodule abutting the pleura
(series 4/image 58) with faint surrounding ground-glass opacity. No
acute consolidative airspace disease, lung masses or additional
significant pulmonary nodules. Minimal dependent left lower lobe
atelectasis.

Upper abdomen: No acute abnormality.

Musculoskeletal: No aggressive appearing focal osseous lesions. Mild
thoracic spondylosis.
IMPRESSION: 1. Solid 2.3 cm peripheral left upper lobe pulmonary nodule with
faint surrounding ground-glass opacity. This nodule is indeterminate
for malignancy versus infection. Consider one of the following in 3
months for both low-risk and high-risk individuals: (a) repeat chest
CT, (b) follow-up PET-CT, or (c) tissue sampling. This
recommendation follows the consensus statement: Guidelines for
Management of Incidental Pulmonary Nodules Detected on CT Images:
2. Mild left hilar adenopathy, nonspecific.
3. Trace dependent left pleural effusion.

## 2019-04-21 ENCOUNTER — Ambulatory Visit: Payer: 59 | Attending: Internal Medicine

## 2019-04-21 DIAGNOSIS — Z23 Encounter for immunization: Secondary | ICD-10-CM

## 2019-04-21 NOTE — Progress Notes (Signed)
   Covid-19 Vaccination Clinic  Name:  MORT SMELSER    MRN: [id number] DOB: December 28, 1965  04/21/2019  Mr. Sasso was observed post Covid-19 immunization for 15 minutes without incident. He was provided with Vaccine Information Sheet and instruction to access the V-Safe system.   Mr. Hert was instructed to call 911 with any severe reactions post vaccine: Marland Kitchen Difficulty breathing  . Swelling of face and throat  . A fast heartbeat  . A bad rash all over body  . Dizziness and weakness   Immunizations Administered    Name Date Dose VIS Date Route   Pfizer COVID-19 Vaccine 04/21/2019 10:47 AM 0.3 mL 12/27/2018 Intramuscular   Manufacturer: [employer], [employer]   Lot: [id number]   NDC: [address]-3

## 2019-11-17 ENCOUNTER — Other Ambulatory Visit (HOSPITAL_COMMUNITY): Payer: Self-pay | Admitting: Internal Medicine

## 2019-11-17 ENCOUNTER — Ambulatory Visit (HOSPITAL_COMMUNITY)
Admission: RE | Admit: 2019-11-17 | Discharge: 2019-11-17 | Disposition: A | Discharge status: Home / Self Care | Payer: 59 | Source: Ambulatory Visit | Attending: Internal Medicine | Admitting: Internal Medicine

## 2019-11-17 ENCOUNTER — Other Ambulatory Visit: Payer: Self-pay

## 2019-11-17 DIAGNOSIS — M25562 Pain in left knee: Secondary | ICD-10-CM | POA: Insufficient documentation

## 2020-01-06 ENCOUNTER — Other Ambulatory Visit: Payer: Self-pay | Admitting: Internal Medicine

## 2020-01-06 ENCOUNTER — Other Ambulatory Visit (HOSPITAL_COMMUNITY): Payer: Self-pay | Admitting: Internal Medicine

## 2020-01-06 DIAGNOSIS — M5412 Radiculopathy, cervical region: Secondary | ICD-10-CM

## 2020-01-07 ENCOUNTER — Ambulatory Visit (HOSPITAL_COMMUNITY)
Admission: RE | Admit: 2020-01-07 | Discharge: 2020-01-07 | Disposition: A | Discharge status: Home / Self Care | Payer: 59 | Source: Ambulatory Visit | Attending: Internal Medicine | Admitting: Internal Medicine

## 2020-01-07 ENCOUNTER — Other Ambulatory Visit: Payer: Self-pay

## 2020-01-07 DIAGNOSIS — M5412 Radiculopathy, cervical region: Secondary | ICD-10-CM | POA: Diagnosis present

## 2021-01-25 ENCOUNTER — Other Ambulatory Visit: Payer: Self-pay | Admitting: Internal Medicine

## 2021-01-25 ENCOUNTER — Other Ambulatory Visit (HOSPITAL_COMMUNITY): Payer: Self-pay | Admitting: Internal Medicine

## 2021-01-25 DIAGNOSIS — R319 Hematuria, unspecified: Secondary | ICD-10-CM

## 2021-01-25 DIAGNOSIS — R1084 Generalized abdominal pain: Secondary | ICD-10-CM

## 2021-01-28 ENCOUNTER — Ambulatory Visit (HOSPITAL_COMMUNITY)
Admission: RE | Admit: 2021-01-28 | Discharge: 2021-01-28 | Disposition: A | Discharge status: Home / Self Care | Payer: 59 | Source: Ambulatory Visit | Attending: Internal Medicine | Admitting: Internal Medicine

## 2021-01-28 ENCOUNTER — Other Ambulatory Visit: Payer: Self-pay

## 2021-01-28 DIAGNOSIS — R1084 Generalized abdominal pain: Secondary | ICD-10-CM | POA: Diagnosis present

## 2021-01-28 DIAGNOSIS — R319 Hematuria, unspecified: Secondary | ICD-10-CM | POA: Insufficient documentation

## 2021-01-28 MED ORDER — SODIUM CHLORIDE (PF) 0.9 % IJ SOLN
INTRAMUSCULAR | Status: AC
  Filled 2021-01-28: qty 50

## 2021-01-28 MED ORDER — SODIUM CHLORIDE 0.9 % IV SOLN
INTRAVENOUS | Status: AC
  Filled 2021-01-28: qty 250

## 2021-01-28 MED ORDER — IOHEXOL 300 MG/ML  SOLN
100.0000 mL | Freq: Once | INTRAMUSCULAR | Status: AC | PRN
  Administered 2021-01-28: 100 mL via INTRAVENOUS

## 2021-02-03 ENCOUNTER — Encounter: Payer: Self-pay | Admitting: Urology

## 2021-02-03 ENCOUNTER — Telehealth: Payer: Self-pay

## 2021-02-03 NOTE — Telephone Encounter (Signed)
Error

## 2021-02-09 ENCOUNTER — Other Ambulatory Visit: Payer: Self-pay

## 2021-02-09 ENCOUNTER — Encounter: Payer: Self-pay | Admitting: Urology

## 2021-02-09 ENCOUNTER — Ambulatory Visit: Payer: 59 | Admitting: Urology

## 2021-02-09 VITALS — BP 136/88 | HR 80 | Wt 190.0 lb

## 2021-02-09 DIAGNOSIS — N401 Enlarged prostate with lower urinary tract symptoms: Secondary | ICD-10-CM | POA: Diagnosis not present

## 2021-02-09 DIAGNOSIS — R319 Hematuria, unspecified: Secondary | ICD-10-CM

## 2021-02-09 DIAGNOSIS — N138 Other obstructive and reflux uropathy: Secondary | ICD-10-CM | POA: Insufficient documentation

## 2021-02-09 LAB — URINALYSIS, ROUTINE W REFLEX MICROSCOPIC
Bilirubin, UA: NEGATIVE
Glucose, UA: NEGATIVE
Leukocytes,UA: NEGATIVE
Nitrite, UA: NEGATIVE
Protein,UA: NEGATIVE
Specific Gravity, UA: 1.015 (ref 1.005–1.030)
Urobilinogen, Ur: 0.2 mg/dL (ref 0.2–1.0)
pH, UA: 7 (ref 5.0–7.5)

## 2021-02-09 LAB — MICROSCOPIC EXAMINATION
Bacteria, UA: NONE SEEN
Renal Epithel, UA: NONE SEEN /hpf
WBC, UA: NONE SEEN /hpf (ref 0–5)

## 2021-02-09 LAB — BLADDER SCAN AMB NON-IMAGING: Scan Result: 20

## 2021-02-09 MED ORDER — ALFUZOSIN HCL ER 10 MG PO TB24: 10.0000 mg | ORAL_TABLET | Freq: Every day | ORAL | 11 refills | Status: DC

## 2021-02-09 NOTE — Progress Notes (Signed)
Urological Symptom Review  Patient is experiencing the following symptoms: Frequent urination Get up at night to urinate Blood in urine Penile pain (male only)    Review of Systems  Gastrointestinal (upper)  : Negative for upper GI symptoms  Gastrointestinal (lower) : Negative for lower GI symptoms  Constitutional : Negative for symptoms  Skin: Negative for skin symptoms  Eyes: Negative for eye symptoms  Ear/Nose/Throat : Negative for Ear/Nose/Throat symptoms  Hematologic/Lymphatic: Negative for Hematologic/Lymphatic symptoms  Cardiovascular : Negative for cardiovascular symptoms  Respiratory : Negative for respiratory symptoms  Endocrine: Negative for endocrine symptoms  Musculoskeletal: Negative for musculoskeletal symptoms  Neurological: Negative for neurological symptoms  Psychologic: Negative for psychiatric symptoms

## 2021-02-09 NOTE — Progress Notes (Signed)
post void residual 20ml

## 2021-02-09 NOTE — Addendum Note (Signed)
Addended by: Winn Jock on: 02/09/2021 03:33 PM   Modules accepted: Orders

## 2021-02-09 NOTE — Progress Notes (Signed)
Assessment: 1. BPH with obstruction/lower urinary tract symptoms   2. Hematuria, dipstick only     Plan: I personally reviewed the records from Dr. Ria Comment office including office notes and lab results.  I also personally reviewed the CT study from 01/28/2021.  I discussed these results with the patient today. Diagnosis and management options for BPH with obstruction discussed with the patient today.  Options including observation, medical therapy, minimally invasive procedures, and surgical therapy discussed. Recommend medical therapy as initial treatment. Rx for alfuzosin 10 mg daily sent to pharmacy.  Use and side effects discussed. Diagnosis of microscopic hematuria discussed with the patient.  I advised him that hematuria evaluation is based on the microscopic urinalysis rather than the dipstick result.  His microscopic urinalysis today does not show any evidence of hematuria.  Recommend observation with follow-up urine at next visit. Return to office in 1 month.  Chief Complaint:  Chief Complaint  Patient presents with   Benign Prostatic Hypertrophy   Hematuria    History of Present Illness:  Joseph Castro is a 56 y.o. year old male who is seen in consultation from Asencion Noble, MD for evaluation of microscopic hematuria and BPH.  He was recently seen by Dr. Willey Blade for lower abdominal pain.  He reports pressure in the suprapubic area for several months.  This is fairly constant in nature.  It is not associated with voiding or bowel movements.  He also reports urinary frequency, nocturia 1-2 times.  No dysuria or gross hematuria.  No flank pain.  IPSS = 12 today.  Dipstick urinalysis from 01/25/2021 demonstrated 2+ blood.  CT abdomen and pelvis with and without contrast from 01/28/2021 showed no renal or ureteral calculi, no evidence of obstruction, no renal masses, and an enlarged prostate with median lobe hypertrophy.  PSA from 4/22: 0.3  Past Medical History:  Past Medical  History:  Diagnosis Date   Allergic response, sequela    Eosinophilia    Hypertension    Right inguinal hernia     Past Surgical History:  Past Surgical History:  Procedure Laterality Date   HERNIA REPAIR      Allergies:  No Known Allergies  Family History:  Family History  Problem Relation Age of Onset   Rheum arthritis Mother        ?   Hypertension Mother    Diabetes Father     Social History:  Social History   Tobacco Use   Smoking status: Never   Smokeless tobacco: Never  Substance Use Topics   Alcohol use: No    Review of symptoms:  Constitutional:  Negative for unexplained weight loss, night sweats, fever, chills ENT:  Negative for nose bleeds, sinus pain, painful swallowing CV:  Negative for chest pain, shortness of breath, exercise intolerance, palpitations, loss of consciousness Resp:  Negative for cough, wheezing, shortness of breath GI:  Negative for nausea, vomiting, diarrhea, bloody stools GU:  Positives noted in HPI; otherwise negative for gross hematuria, dysuria, urinary incontinence Neuro:  Negative for seizures, poor balance, limb weakness, slurred speech Psych:  Negative for lack of energy, depression, anxiety Endocrine:  Negative for polydipsia, polyuria, symptoms of hypoglycemia (dizziness, hunger, sweating) Hematologic:  Negative for anemia, purpura, petechia, prolonged or excessive bleeding, use of anticoagulants  Allergic:  Negative for difficulty breathing or choking as a result of exposure to anything; no shellfish allergy; no allergic response (rash/itch) to materials, foods  Physical exam: BP 136/88    Pulse 80  Wt 190 lb (86.2 kg)    BMI 26.50 kg/m  GENERAL APPEARANCE:  Well appearing, well developed, well nourished, NAD HEENT: Atraumatic, Normocephalic, oropharynx clear. NECK: Supple without lymphadenopathy or thyromegaly. LUNGS: Clear to auscultation bilaterally. HEART: Regular Rate and Rhythm without murmurs, gallops, or  rubs. ABDOMEN: Soft, non-tender, No Masses. EXTREMITIES: Moves all extremities well.  Without clubbing, cyanosis, or edema. NEUROLOGIC:  Alert and oriented x 3, normal gait, CN II-XII grossly intact.  MENTAL STATUS:  Appropriate. BACK:  Non-tender to palpation.  No CVAT SKIN:  Warm, dry and intact.   GU: Penis:  normal Meatus: Normal Scrotum: normal, no masses Testis: normal without masses bilateral Epididymis: normal Prostate: 40 g, NT, no nodules Rectum: Normal tone,  no masses or tenderness   Results: U/A:  0-2 RBC  PVR: 20 ml

## 2021-02-21 ENCOUNTER — Ambulatory Visit: Payer: 59 | Admitting: Urology

## 2021-03-08 ENCOUNTER — Encounter: Payer: Self-pay | Admitting: Urology

## 2021-03-08 ENCOUNTER — Other Ambulatory Visit: Payer: Self-pay

## 2021-03-08 ENCOUNTER — Ambulatory Visit: Payer: 59 | Admitting: Urology

## 2021-03-08 VITALS — BP 134/80 | HR 102 | Wt 201.0 lb

## 2021-03-08 DIAGNOSIS — N401 Enlarged prostate with lower urinary tract symptoms: Secondary | ICD-10-CM | POA: Diagnosis not present

## 2021-03-08 DIAGNOSIS — N138 Other obstructive and reflux uropathy: Secondary | ICD-10-CM

## 2021-03-08 DIAGNOSIS — R319 Hematuria, unspecified: Secondary | ICD-10-CM

## 2021-03-08 LAB — URINALYSIS, ROUTINE W REFLEX MICROSCOPIC
Bilirubin, UA: NEGATIVE
Glucose, UA: NEGATIVE
Ketones, UA: NEGATIVE
Leukocytes,UA: NEGATIVE
Nitrite, UA: NEGATIVE
Protein,UA: NEGATIVE
RBC, UA: NEGATIVE
Specific Gravity, UA: >1.03 — ABNORMAL HIGH (ref 1.005–1.030)
Urobilinogen, Ur: 0.2 mg/dL (ref 0.2–1.0)
pH, UA: 6 (ref 5.0–7.5)

## 2021-03-08 NOTE — Progress Notes (Signed)
Assessment: 1. BPH with obstruction/lower urinary tract symptoms     Plan: Continue alfuzosin 10 mg daily. Return to office in 3 months.  Chief Complaint:  Chief Complaint  Patient presents with   Benign Prostatic Hypertrophy    History of Present Illness:  Joseph Castro is a 56 y.o. year old male who is seen for further evaluation of microscopic hematuria and BPH.  He was recently seen by Dr. Willey Blade for lower abdominal pain.  He reports pressure in the suprapubic area for several months.  This is fairly constant in nature.  It is not associated with voiding or bowel movements.  He also reports urinary frequency, nocturia 1-2 times.  No dysuria or gross hematuria.  No flank pain.  IPSS = 12. PVR = 20 ml  Dipstick urinalysis from 01/25/2021 demonstrated 2+ blood. Urinalysis from 02/09/2021 showed 0-2 RBCs.  CT abdomen and pelvis with and without contrast from 01/28/2021 showed no renal or ureteral calculi, no evidence of obstruction, no renal masses, and an enlarged prostate with median lobe hypertrophy.  PSA from 4/22: 0.3  He was given a trial of alfuzosin in 1/23.  He returns today for follow-up.  He continues on alfuzosin.  He has noted some improvement in his lower urinary tract symptoms with the medication.  No side effects.  He does have some occasional discomfort in the lower abdomen which is not associated with a full bladder or with voiding.  No dysuria or gross hematuria. IPSS = 9 today.  Portions of the above documentation were copied from a prior visit for review purposes only.   Past Medical History:  Past Medical History:  Diagnosis Date   Allergic response, sequela    Eosinophilia    Hypertension    Right inguinal hernia     Past Surgical History:  Past Surgical History:  Procedure Laterality Date   HERNIA REPAIR      Allergies:  No Known Allergies  Family History:  Family History  Problem Relation Age of Onset   Rheum arthritis Mother         ?   Hypertension Mother    Diabetes Father     Social History:  Social History   Tobacco Use   Smoking status: Never   Smokeless tobacco: Never  Substance Use Topics   Alcohol use: No    ROS: Constitutional:  Negative for fever, chills, weight loss CV: Negative for chest pain, previous MI, hypertension Respiratory:  Negative for shortness of breath, wheezing, sleep apnea, frequent cough GI:  Negative for nausea, vomiting, bloody stool, GERD  Physical exam: BP 134/80    Pulse (!) 102    Wt 201 lb (91.2 kg)    BMI 28.03 kg/m  GENERAL APPEARANCE:  Well appearing, well developed, well nourished, NAD HEENT:  Atraumatic, normocephalic, oropharynx clear NECK:  Supple without lymphadenopathy or thyromegaly ABDOMEN:  Soft, non-tender, no masses EXTREMITIES:  Moves all extremities well, without clubbing, cyanosis, or edema NEUROLOGIC:  Alert and oriented x 3, normal gait, CN II-XII grossly intact MENTAL STATUS:  appropriate BACK:  Non-tender to palpation, No CVAT SKIN:  Warm, dry, and intact  Results: Results for orders placed or performed in visit on 03/08/21 (from the past 24 hour(s))  Urinalysis, Routine w reflex microscopic     Status: Abnormal   Collection Time: 03/08/21  4:06 PM  Result Value Ref Range   Specific Gravity, UA >1.030 (H) 1.005 - 1.030   pH, UA 6.0 5.0 - 7.5  Color, UA Amber (A) Yellow   Appearance Ur Clear Clear   Leukocytes,UA Negative Negative   Protein,UA Negative Negative/Trace   Glucose, UA Negative Negative   Ketones, UA Negative Negative   RBC, UA Negative Negative   Bilirubin, UA Negative Negative   Urobilinogen, Ur 0.2 0.2 - 1.0 mg/dL   Nitrite, UA Negative Negative   Microscopic Examination Comment    Narrative   Performed at:  [facility] [address], [place], [place]  [id number] Lab Director: [place], Phone:  [phone]

## 2021-06-07 ENCOUNTER — Ambulatory Visit: Payer: 59 | Admitting: Urology

## 2021-06-07 ENCOUNTER — Encounter: Payer: Self-pay | Admitting: Urology

## 2021-06-07 VITALS — BP 109/73 | HR 125 | Ht 71.0 in | Wt 195.0 lb

## 2021-06-07 DIAGNOSIS — N401 Enlarged prostate with lower urinary tract symptoms: Secondary | ICD-10-CM | POA: Diagnosis not present

## 2021-06-07 DIAGNOSIS — N138 Other obstructive and reflux uropathy: Secondary | ICD-10-CM | POA: Diagnosis not present

## 2021-06-07 LAB — MICROSCOPIC EXAMINATION
Bacteria, UA: NONE SEEN
Renal Epithel, UA: NONE SEEN /hpf
WBC, UA: NONE SEEN /hpf (ref 0–5)

## 2021-06-07 LAB — URINALYSIS, ROUTINE W REFLEX MICROSCOPIC
Bilirubin, UA: NEGATIVE
Glucose, UA: NEGATIVE
Ketones, UA: NEGATIVE
Leukocytes,UA: NEGATIVE
Nitrite, UA: NEGATIVE
Protein,UA: NEGATIVE
Specific Gravity, UA: 1.02 (ref 1.005–1.030)
Urobilinogen, Ur: 0.2 mg/dL (ref 0.2–1.0)
pH, UA: 6 (ref 5.0–7.5)

## 2021-06-07 LAB — BLADDER SCAN AMB NON-IMAGING: Scan Result: 0

## 2021-06-07 NOTE — Progress Notes (Signed)
Assessment: 1. BPH with obstruction/lower urinary tract symptoms     Plan: Continue alfuzosin 10 mg daily. Return to office in 6 months. Request recent PSA result from Dr. Alonza Smoker office  Chief Complaint:  Chief Complaint  Patient presents with   Benign Prostatic Hypertrophy    History of Present Illness:  Joseph Castro is a 56 y.o. year old male who is seen for further evaluation of microscopic hematuria and BPH.  He was seen by Dr. Ouida Sills for lower abdominal pain.  He reported pressure in the suprapubic area for several months, fairly constant in nature.  The pain was not associated with voiding or bowel movements.  He also reported urinary frequency, nocturia 1-2 times.  No dysuria or gross hematuria.  No flank pain.  IPSS = 12. PVR = 20 ml  Dipstick urinalysis from 01/25/2021 demonstrated 2+ blood. Urinalysis from 02/09/2021 showed 0-2 RBCs.  CT abdomen and pelvis with and without contrast from 01/28/2021 showed no renal or ureteral calculi, no evidence of obstruction, no renal masses, and an enlarged prostate with median lobe hypertrophy.  PSA from 4/22: 0.3  He was given a trial of alfuzosin in 1/23.  At his visit in 2/23, he continued on alfuzosin.  He had noted some improvement in his lower urinary tract symptoms with the medication.  No side effects.  He continued to have some occasional discomfort in the lower abdomen not associated with a full bladder or with voiding.  No dysuria or gross hematuria. U/A:  negative for blood IPSS = 9.  He returns today for follow-up.  He continues on alfuzosin 10 mg daily.  He has noted further improvement in his lower urinary tract symptoms.  He reports an improved stream and his nocturia has decreased to 0-1 time per night.  He feels like he is emptying his bladder well.  No dysuria or gross hematuria.  No side effects from the medication.  He is overall very pleased with his current situation. IPSS = 4  QOL=1/6  Portions of the  above documentation were copied from a prior visit for review purposes only.   Past Medical History:  Past Medical History:  Diagnosis Date   Allergic response, sequela    Eosinophilia    Hypertension    Right inguinal hernia     Past Surgical History:  Past Surgical History:  Procedure Laterality Date   HERNIA REPAIR      Allergies:  No Known Allergies  Family History:  Family History  Problem Relation Age of Onset   Rheum arthritis Mother        ?   Hypertension Mother    Diabetes Father     Social History:  Social History   Tobacco Use   Smoking status: Never   Smokeless tobacco: Never  Substance Use Topics   Alcohol use: No    ROS: Constitutional:  Negative for fever, chills, weight loss CV: Negative for chest pain, previous MI, hypertension Respiratory:  Negative for shortness of breath, wheezing, sleep apnea, frequent cough GI:  Negative for nausea, vomiting, bloody stool, GERD  Physical exam: BP 109/73   Pulse (!) 125   Ht 5\' 11"  (1.803 m)   Wt 195 lb (88.5 kg)   BMI 27.20 kg/m  GENERAL APPEARANCE:  Well appearing, well developed, well nourished, NAD HEENT:  Atraumatic, normocephalic, oropharynx clear NECK:  Supple without lymphadenopathy or thyromegaly ABDOMEN:  Soft, non-tender, no masses EXTREMITIES:  Moves all extremities well, without clubbing, cyanosis, or edema  NEUROLOGIC:  Alert and oriented x 3, normal gait, CN II-XII grossly intact MENTAL STATUS:  appropriate BACK:  Non-tender to palpation, No CVAT SKIN:  Warm, dry, and intact  Results: U/A:  0-2 RBC  PVR = 0 ml

## 2021-06-07 NOTE — Progress Notes (Signed)
post void residual =0mL 

## 2021-06-08 ENCOUNTER — Other Ambulatory Visit: Payer: Self-pay

## 2021-12-06 ENCOUNTER — Ambulatory Visit (INDEPENDENT_AMBULATORY_CARE_PROVIDER_SITE_OTHER): Payer: 59 | Admitting: Urology

## 2021-12-06 ENCOUNTER — Encounter: Payer: Self-pay | Admitting: Urology

## 2021-12-06 VITALS — BP 138/93 | HR 67

## 2021-12-06 DIAGNOSIS — R319 Hematuria, unspecified: Secondary | ICD-10-CM

## 2021-12-06 DIAGNOSIS — N138 Other obstructive and reflux uropathy: Secondary | ICD-10-CM

## 2021-12-06 DIAGNOSIS — N401 Enlarged prostate with lower urinary tract symptoms: Secondary | ICD-10-CM | POA: Diagnosis not present

## 2021-12-06 NOTE — Progress Notes (Signed)
Assessment: 1. BPH with obstruction/lower urinary tract symptoms   2. Hematuria, dipstick only     Plan: Continue alfuzosin 10 mg daily. Return to office in 6 months.  Chief Complaint:  Chief Complaint  Patient presents with   Benign Prostatic Hypertrophy    History of Present Illness:  Joseph Castro is a 56 y.o. year old male who is seen for further evaluation of microscopic hematuria and BPH.   He was seen by Dr. Ouida Sills for lower abdominal pain.  He reported pressure in the suprapubic area for several months, fairly constant in nature.  The pain was not associated with voiding or bowel movements.  He also reported urinary frequency, nocturia 1-2 times.  No dysuria or gross hematuria.  No flank pain.  IPSS = 12. PVR = 20 ml  Dipstick urinalysis from 01/25/2021 demonstrated 2+ blood. Urinalysis from 02/09/2021 showed 0-2 RBCs.  CT abdomen and pelvis with and without contrast from 01/28/2021 showed no renal or ureteral calculi, no evidence of obstruction, no renal masses, and an enlarged prostate with median lobe hypertrophy.  PSA from 4/22: 0.3  He was given a trial of alfuzosin in 1/23.  At his visit in 2/23, he continued on alfuzosin.  He had noted some improvement in his lower urinary tract symptoms with the medication.  No side effects.  He continued to have some occasional discomfort in the lower abdomen not associated with a full bladder or with voiding.  No dysuria or gross hematuria. U/A:  negative for blood IPSS = 9. PSA from 4/23: 0.2  At his visit in May 2023, he continued on alfuzosin 10 mg daily further improvement in his lower urinary tract symptoms.  He reported an improved stream and his nocturia had decreased to 0-1 time per night.  No dysuria or gross hematuria.  No side effects from the medication.  He was overall very pleased with his current situation. IPSS = 4  QOL=1/6.  He returns today for follow-up.  He continues on alfuzosin 10 mg daily.  His urinary  symptoms remain fairly stable.  He does report some frequency and urgency.  No dysuria or gross hematuria.  He right stopping the medication but noted worsening of his lower urinary tract symptoms. IPSS = 8 QOL = 3/6 today   Portions of the above documentation were copied from a prior visit for review purposes only.   Past Medical History:  Past Medical History:  Diagnosis Date   Allergic response, sequela    Eosinophilia    Hypertension    Right inguinal hernia     Past Surgical History:  Past Surgical History:  Procedure Laterality Date   HERNIA REPAIR      Allergies:  No Known Allergies  Family History:  Family History  Problem Relation Age of Onset   Rheum arthritis Mother        ?   Hypertension Mother    Diabetes Father     Social History:  Social History   Tobacco Use   Smoking status: Never   Smokeless tobacco: Never  Substance Use Topics   Alcohol use: No    ROS: Constitutional:  Negative for fever, chills, weight loss CV: Negative for chest pain, previous MI, hypertension Respiratory:  Negative for shortness of breath, wheezing, sleep apnea, frequent cough GI:  Negative for nausea, vomiting, bloody stool, GERD  Physical exam: BP (!) 138/93   Pulse 67  GENERAL APPEARANCE:  Well appearing, well developed, well nourished, NAD HEENT:  Atraumatic, normocephalic, oropharynx clear NECK:  Supple without lymphadenopathy or thyromegaly ABDOMEN:  Soft, non-tender, no masses EXTREMITIES:  Moves all extremities well, without clubbing, cyanosis, or edema NEUROLOGIC:  Alert and oriented x 3, normal gait, CN II-XII grossly intact MENTAL STATUS:  appropriate BACK:  Non-tender to palpation, No CVAT SKIN:  Warm, dry, and intact  Results: U/A: 0-2 RBC

## 2021-12-07 LAB — MICROSCOPIC EXAMINATION
Bacteria, UA: NONE SEEN
Epithelial Cells (non renal): NONE SEEN /hpf (ref 0–10)
WBC, UA: NONE SEEN /hpf (ref 0–5)

## 2021-12-07 LAB — URINALYSIS, ROUTINE W REFLEX MICROSCOPIC
Bilirubin, UA: NEGATIVE
Glucose, UA: NEGATIVE
Ketones, UA: NEGATIVE
Leukocytes,UA: NEGATIVE
Nitrite, UA: NEGATIVE
Protein,UA: NEGATIVE
Specific Gravity, UA: 1.01 (ref 1.005–1.030)
Urobilinogen, Ur: 0.2 mg/dL (ref 0.2–1.0)
pH, UA: 7 (ref 5.0–7.5)

## 2022-02-25 ENCOUNTER — Other Ambulatory Visit: Payer: Self-pay | Admitting: Urology

## 2022-02-25 DIAGNOSIS — N138 Other obstructive and reflux uropathy: Secondary | ICD-10-CM

## 2022-06-07 ENCOUNTER — Encounter: Payer: Self-pay | Admitting: Urology

## 2022-06-07 ENCOUNTER — Ambulatory Visit: Payer: 59 | Admitting: Urology

## 2022-06-07 VITALS — BP 140/76 | HR 97

## 2022-06-07 DIAGNOSIS — N401 Enlarged prostate with lower urinary tract symptoms: Secondary | ICD-10-CM | POA: Diagnosis not present

## 2022-06-07 DIAGNOSIS — N138 Other obstructive and reflux uropathy: Secondary | ICD-10-CM

## 2022-06-07 DIAGNOSIS — R3129 Other microscopic hematuria: Secondary | ICD-10-CM

## 2022-06-07 DIAGNOSIS — R351 Nocturia: Secondary | ICD-10-CM | POA: Diagnosis not present

## 2022-06-07 MED ORDER — SILODOSIN 8 MG PO CAPS
8.0000 mg | ORAL_CAPSULE | Freq: Every day | ORAL | 11 refills | Status: DC
Start: 2022-06-07 — End: 2022-12-11

## 2022-06-07 NOTE — Progress Notes (Unsigned)
06/07/2022 3:49 PM   Joseph Castro 1965/08/04 [id number]  Referring provider: Carylon Perches, MD [address] [place],  [place] [address]  Chief Complaint  Patient presents with   Follow-up   Benign Prostatic Hypertrophy    HPI: UA shows no RBCs. IPSS 10 QOL 2 on uroxatral 10mg  daily. Nocturia 1x, Urine stream fair.    PMH: Past Medical History:  Diagnosis Date   Allergic response, sequela    Eosinophilia    Hypertension    Right inguinal hernia     Surgical History: Past Surgical History:  Procedure Laterality Date   HERNIA REPAIR      Home Medications:  Allergies as of 06/07/2022   No Known Allergies      Medication List        Accurate as of Jun 07, 2022  3:49 PM. If you have any questions, ask your nurse or doctor.          alfuzosin 10 MG 24 hr tablet Commonly known as: UROXATRAL TAKE 1 TABLET (10 MG TOTAL) BY MOUTH DAILY WITH BREAKFAST.   amLODipine 5 MG tablet Commonly known as: NORVASC Take 5 mg by mouth daily.   cetirizine 10 MG tablet Commonly known as: ZYRTEC Take 10 mg by mouth daily.   losartan 50 MG tablet Commonly known as: COZAAR Take 50 mg by mouth daily.   multivitamin with minerals tablet Take 1 tablet by mouth daily.        Allergies: No Known Allergies  Family History: Family History  Problem Relation Age of Onset   Rheum arthritis Mother        ?   Hypertension Mother    Diabetes Father     Social History:  reports that he has never smoked. He has never used smokeless tobacco. He reports that he does not drink alcohol. No history on file for drug use.  ROS: All other review of systems were reviewed and are negative except what is noted above in HPI  Physical Exam: BP (!) 140/76   Pulse 97   Constitutional:  Alert and oriented, No acute distress. HEENT: [place] AT, moist mucus membranes.  Trachea midline, no masses. Cardiovascular: No clubbing, cyanosis, or edema. Respiratory: Normal respiratory  effort, no increased work of breathing. GI: Abdomen is soft, nontender, nondistended, no abdominal masses GU: No CVA tenderness.  Lymph: No cervical or inguinal lymphadenopathy. Skin: No rashes, bruises or suspicious lesions. Neurologic: Grossly intact, no focal deficits, moving all 4 extremities. Psychiatric: Normal mood and affect.  Laboratory Data: Lab Results  Component Value Date   WBC 11.0 (H) 12/06/2016   HGB 14.1 12/06/2016   HCT 42.1 12/06/2016   MCV 79.6 12/06/2016   PLT 186 12/06/2016    Lab Results  Component Value Date   CREATININE 0.90 05/02/2017    No results found for: "PSA"  No results found for: "TESTOSTERONE"  No results found for: "HGBA1C"  Urinalysis    Component Value Date/Time   COLORURINE YELLOW 04/20/2016 1136   APPEARANCEUR Clear 12/06/2021 1545   LABSPEC 1.018 04/20/2016 1136   PHURINE 6.0 04/20/2016 1136   GLUCOSEU Negative 12/06/2021 1545   HGBUR NEGATIVE 04/20/2016 1136   BILIRUBINUR Negative 12/06/2021 1545   KETONESUR NEGATIVE 04/20/2016 1136   PROTEINUR Negative 12/06/2021 1545   PROTEINUR NEGATIVE 04/20/2016 1136   NITRITE Negative 12/06/2021 1545   NITRITE NEGATIVE 04/20/2016 1136   LEUKOCYTESUR Negative 12/06/2021 1545    Lab Results  Component Value Date   LABMICR  See below: 12/06/2021   WBCUA None seen 12/06/2021   LABEPIT None seen 12/06/2021   MUCUS Present 06/07/2021   BACTERIA None seen 12/06/2021    Pertinent Imaging: *** No results found for this or any previous visit.  No results found for this or any previous visit.  No results found for this or any previous visit.  No results found for this or any previous visit.  No results found for this or any previous visit.  No valid procedures specified. No results found for this or any previous visit.  Results for orders placed during the hospital encounter of 04/20/16  CT Renal Stone Study  Narrative CLINICAL DATA:  Patient with low back pain and  nausea.  EXAM: CT ABDOMEN AND PELVIS WITHOUT CONTRAST  TECHNIQUE: Multidetector CT imaging of the abdomen and pelvis was performed following the standard protocol without IV contrast.  COMPARISON:  None.  FINDINGS: Lower chest: Lung bases are clear.  Normal heart size.  Hepatobiliary: Liver is normal in size and contour. Gallbladder is unremarkable.  Pancreas: Unremarkable  Spleen: Unremarkable  Adrenals/Urinary Tract: Normal adrenal glands. Kidneys are symmetric in size. No hydronephrosis. No nephroureterolithiasis. Urinary bladder is unremarkable.  Stomach/Bowel: No abnormal bowel wall thickening or evidence for bowel obstruction. No free fluid or free intraperitoneal air. Normal morphology of the stomach.  Vascular/Lymphatic: Normal caliber abdominal aorta. No retroperitoneal lymphadenopathy.  Reproductive: Prostate unremarkable.  Other: None.  Musculoskeletal: No aggressive or acute appearing osseous lesions. Lumbar spine degenerative changes.  IMPRESSION: No nephroureterolithiasis.  No hydronephrosis.  No acute process identified within the abdomen or pelvis.   Electronically Signed By: Annia Belt M.D. On: 04/20/2016 12:50   Assessment & Plan:    1. BPH with obstruction/lower urinary tract symptoms *** - Urinalysis, Routine w reflex microscopic  2. Nocturia We will trial rapaflo 8mg  daily  3. Microhematuria Followup 6 months with UA   No follow-ups on file.  Wilkie Aye, MD  [facility] [place]

## 2022-06-07 NOTE — Patient Instructions (Signed)

## 2022-06-08 LAB — URINALYSIS, ROUTINE W REFLEX MICROSCOPIC
Bilirubin, UA: NEGATIVE
Glucose, UA: NEGATIVE
Ketones, UA: NEGATIVE
Leukocytes,UA: NEGATIVE
Nitrite, UA: NEGATIVE
Specific Gravity, UA: 1.02 (ref 1.005–1.030)
Urobilinogen, Ur: 1 mg/dL (ref 0.2–1.0)
pH, UA: 7 (ref 5.0–7.5)

## 2022-06-08 LAB — MICROSCOPIC EXAMINATION: Bacteria, UA: NONE SEEN

## 2022-12-11 ENCOUNTER — Ambulatory Visit: Payer: 59 | Admitting: Urology

## 2022-12-11 VITALS — BP 137/80 | HR 84

## 2022-12-11 DIAGNOSIS — N138 Other obstructive and reflux uropathy: Secondary | ICD-10-CM

## 2022-12-11 DIAGNOSIS — N401 Enlarged prostate with lower urinary tract symptoms: Secondary | ICD-10-CM

## 2022-12-11 DIAGNOSIS — R351 Nocturia: Secondary | ICD-10-CM | POA: Diagnosis not present

## 2022-12-11 DIAGNOSIS — R3129 Other microscopic hematuria: Secondary | ICD-10-CM

## 2022-12-11 MED ORDER — ALFUZOSIN HCL ER 10 MG PO TB24: 10.0000 mg | ORAL_TABLET | Freq: Every day | ORAL | 3 refills | Status: DC

## 2022-12-11 NOTE — Progress Notes (Unsigned)
12/11/2022 3:36 PM   Joseph Castro [birth year] [id number]  Referring provider: Carylon Perches, MD [address] [place],  [place] [address]  Followup BPH   HPI: Mr Joseph Castro is a 57yo here for followup for BPH and microhematuria. IPSS 4 QOL 1 on uroxatral 10mg  at bedtime. He stopped rapaflo due to side effects. UA today shows no RBCs.    PMH: Past Medical History:  Diagnosis Date   Allergic response, sequela    Eosinophilia    Hypertension    Right inguinal hernia     Surgical History: Past Surgical History:  Procedure Laterality Date   HERNIA REPAIR      Home Medications:  Allergies as of 12/11/2022   No Known Allergies      Medication List        Accurate as of December 11, 2022  3:36 PM. If you have any questions, ask your nurse or doctor.          alfuzosin 10 MG 24 hr tablet Commonly known as: UROXATRAL TAKE 1 TABLET (10 MG TOTAL) BY MOUTH DAILY WITH BREAKFAST.   amLODipine 5 MG tablet Commonly known as: NORVASC Take 5 mg by mouth daily.   cetirizine 10 MG tablet Commonly known as: ZYRTEC Take 10 mg by mouth daily.   losartan 50 MG tablet Commonly known as: COZAAR Take 50 mg by mouth daily.   multivitamin with minerals tablet Take 1 tablet by mouth daily.   silodosin 8 MG Caps capsule Commonly known as: RAPAFLO Take 1 capsule (8 mg total) by mouth at bedtime.        Allergies: No Known Allergies  Family History: Family History  Problem Relation Age of Onset   Rheum arthritis Mother        ?   Hypertension Mother    Diabetes Father     Social History:  reports that he has never smoked. He has never used smokeless tobacco. He reports that he does not drink alcohol. No history on file for drug use.  ROS: All other review of systems were reviewed and are negative except what is noted above in HPI  Physical Exam: BP 137/80   Pulse 84   Constitutional:  Alert and oriented, No acute distress. HEENT: [place] AT, moist mucus  membranes.  Trachea midline, no masses. Cardiovascular: No clubbing, cyanosis, or edema. Respiratory: Normal respiratory effort, no increased work of breathing. GI: Abdomen is soft, nontender, nondistended, no abdominal masses GU: No CVA tenderness.  Lymph: No cervical or inguinal lymphadenopathy. Skin: No rashes, bruises or suspicious lesions. Neurologic: Grossly intact, no focal deficits, moving all 4 extremities. Psychiatric: Normal mood and affect.  Laboratory Data: Lab Results  Component Value Date   WBC 11.0 (H) 12/06/2016   HGB 14.1 12/06/2016   HCT 42.1 12/06/2016   MCV 79.6 12/06/2016   PLT 186 12/06/2016    Lab Results  Component Value Date   CREATININE 0.90 05/02/2017    No results found for: "PSA"  No results found for: "TESTOSTERONE"  No results found for: "HGBA1C"  Urinalysis    Component Value Date/Time   COLORURINE YELLOW 04/20/2016 1136   APPEARANCEUR Clear 06/07/2022 1544   LABSPEC 1.018 04/20/2016 1136   PHURINE 6.0 04/20/2016 1136   GLUCOSEU Negative 06/07/2022 1544   HGBUR NEGATIVE 04/20/2016 1136   BILIRUBINUR Negative 06/07/2022 1544   KETONESUR NEGATIVE 04/20/2016 1136   PROTEINUR Trace (A) 06/07/2022 1544   PROTEINUR NEGATIVE 04/20/2016 1136   NITRITE Negative 06/07/2022 1544  NITRITE NEGATIVE 04/20/2016 1136   LEUKOCYTESUR Negative 06/07/2022 1544    Lab Results  Component Value Date   LABMICR See below: 06/07/2022   WBCUA 0-5 06/07/2022   LABEPIT 0-10 06/07/2022   MUCUS Present 06/07/2021   BACTERIA None seen 06/07/2022    Pertinent Imaging:  No results found for this or any previous visit.  No results found for this or any previous visit.  No results found for this or any previous visit.  No results found for this or any previous visit.  No results found for this or any previous visit.  No valid procedures specified. No results found for this or any previous visit.  Results for orders placed during the hospital  encounter of 04/20/16  CT Renal Stone Study  Narrative CLINICAL DATA:  Patient with low back pain and nausea.  EXAM: CT ABDOMEN AND PELVIS WITHOUT CONTRAST  TECHNIQUE: Multidetector CT imaging of the abdomen and pelvis was performed following the standard protocol without IV contrast.  COMPARISON:  None.  FINDINGS: Lower chest: Lung bases are clear.  Normal heart size.  Hepatobiliary: Liver is normal in size and contour. Gallbladder is unremarkable.  Pancreas: Unremarkable  Spleen: Unremarkable  Adrenals/Urinary Tract: Normal adrenal glands. Kidneys are symmetric in size. No hydronephrosis. No nephroureterolithiasis. Urinary bladder is unremarkable.  Stomach/Bowel: No abnormal bowel wall thickening or evidence for bowel obstruction. No free fluid or free intraperitoneal air. Normal morphology of the stomach.  Vascular/Lymphatic: Normal caliber abdominal aorta. No retroperitoneal lymphadenopathy.  Reproductive: Prostate unremarkable.  Other: None.  Musculoskeletal: No aggressive or acute appearing osseous lesions. Lumbar spine degenerative changes.  IMPRESSION: No nephroureterolithiasis.  No hydronephrosis.  No acute process identified within the abdomen or pelvis.   Electronically Signed By: Annia Belt M.D. On: 04/20/2016 12:50   Assessment & Plan:    1. BPH with obstruction/lower urinary tract symptoms We will continue uroxatral 10mg  daily  2. Nocturia -continue uroxatral 10mg  qhs  3. Microhematuria resolved   No follow-ups on file.  Joseph Aye, MD  [facility] [place]

## 2022-12-12 ENCOUNTER — Encounter: Payer: Self-pay | Admitting: Urology

## 2022-12-12 NOTE — Patient Instructions (Signed)

## 2023-09-15 ENCOUNTER — Ambulatory Visit
Admission: EM | Admit: 2023-09-15 | Discharge: 2023-09-15 | Disposition: A | Discharge status: Home / Self Care | Attending: Family Medicine | Admitting: Family Medicine

## 2023-09-15 ENCOUNTER — Ambulatory Visit (INDEPENDENT_AMBULATORY_CARE_PROVIDER_SITE_OTHER)

## 2023-09-15 ENCOUNTER — Ambulatory Visit: Payer: Self-pay | Admitting: Urgent Care

## 2023-09-15 DIAGNOSIS — M545 Low back pain, unspecified: Secondary | ICD-10-CM

## 2023-09-15 DIAGNOSIS — M5432 Sciatica, left side: Secondary | ICD-10-CM | POA: Insufficient documentation

## 2023-09-15 DIAGNOSIS — R350 Frequency of micturition: Secondary | ICD-10-CM | POA: Insufficient documentation

## 2023-09-15 DIAGNOSIS — M5431 Sciatica, right side: Secondary | ICD-10-CM | POA: Insufficient documentation

## 2023-09-15 DIAGNOSIS — S39012A Strain of muscle, fascia and tendon of lower back, initial encounter: Secondary | ICD-10-CM | POA: Insufficient documentation

## 2023-09-15 LAB — POCT URINE DIPSTICK
Bilirubin, UA: NEGATIVE
Glucose, UA: NEGATIVE mg/dL
Ketones, POC UA: NEGATIVE mg/dL
Leukocytes, UA: NEGATIVE
Nitrite, UA: NEGATIVE
POC PROTEIN,UA: NEGATIVE
Spec Grav, UA: 1.01 (ref 1.010–1.025)
Urobilinogen, UA: 0.2 U/dL
pH, UA: 7 (ref 5.0–8.0)

## 2023-09-15 MED ORDER — CYCLOBENZAPRINE HCL 5 MG PO TABS: 5.0000 mg | ORAL_TABLET | Freq: Every evening | ORAL | 0 refills | Status: AC | PRN

## 2023-09-15 NOTE — ED Provider Notes (Signed)
 [facility]  Note:  This document was prepared using Conservation officer, historic buildings and may include unintentional dictation errors.  MRN: [id number] DOB: September 28, 1965  Subjective:   Joseph Castro is a 58 y.o. male presenting for 3-day history of persistent moderate to severe low back pain that radiates into either leg.  Symptoms started after he did an extensive amount of work pulling weeds and pressure washing his entire house.  He started have some urinary frequency today.  Denies dysuria, hematuria, penile discharge, penile swelling, testicular pain, testicular swelling, anal pain, groin pain.  Has a history of BPH with lower urinary tract symptoms.  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  No current facility-administered medications for this encounter.  Current Outpatient Medications:    alfuzosin  (UROXATRAL ) 10 MG 24 hr tablet, Take 1 tablet (10 mg total) by mouth at bedtime., Disp: 90 tablet, Rfl: 3   amLODipine (NORVASC) 5 MG tablet, Take 5 mg by mouth daily., Disp: , Rfl:    cetirizine (ZYRTEC) 10 MG tablet, Take 10 mg by mouth daily., Disp: , Rfl:    losartan (COZAAR) 50 MG tablet, Take 50 mg by mouth daily., Disp: , Rfl:    Multiple Vitamins-Minerals (MULTIVITAMIN WITH MINERALS) tablet, Take 1 tablet by mouth daily., Disp: , Rfl:    No Known Allergies  Past Medical History:  Diagnosis Date   Allergic response, sequela    Eosinophilia    Hypertension    Right inguinal hernia      Past Surgical History:  Procedure Laterality Date   HERNIA REPAIR      Family History  Problem Relation Age of Onset   Rheum arthritis Mother        ?   Hypertension Mother    Diabetes Father     Social History   Tobacco Use   Smoking status: Never   Smokeless tobacco: Never  Vaping Use   Vaping status: Never Used  Substance Use Topics   Alcohol use:  No   Drug use: Never    ROS   Objective:   Vitals: BP (!) 144/93 (BP Location: Left Arm)   Pulse 64   Temp 98.1 F (36.7 C) (Oral)   Resp 18   Ht 5' 11 (1.803 m)   Wt 200 lb (90.7 kg)   SpO2 98%   BMI 27.89 kg/m   Physical Exam Constitutional:      General: He is not in acute distress.    Appearance: Normal appearance. He is well-developed and normal weight. He is not ill-appearing, toxic-appearing or diaphoretic.  HENT:     Head: Normocephalic and atraumatic.     Right Ear: External ear normal.     Left Ear: External ear normal.     Nose: Nose normal.     Mouth/Throat:     Pharynx: Oropharynx is clear.  Eyes:     General: No scleral icterus.       Right eye: No discharge.        Left eye: No discharge.     Extraocular Movements: Extraocular movements intact.  Cardiovascular:     Rate and Rhythm: Normal rate.  Pulmonary:     Effort: Pulmonary effort is normal.  Musculoskeletal:     Cervical back: Normal range of motion.     Lumbar back: Spasms and tenderness present. No swelling, edema, deformity, signs of trauma,  lacerations or bony tenderness. Decreased range of motion. Positive right straight leg raise test and positive left straight leg raise test. No scoliosis.  Neurological:     Mental Status: He is alert and oriented to person, place, and time.  Psychiatric:        Mood and Affect: Mood normal.        Behavior: Behavior normal.        Thought Content: Thought content normal.        Judgment: Judgment normal.     Results for orders placed or performed during the hospital encounter of 09/15/23 (from the past 24 hours)  POCT URINE DIPSTICK     Status: Abnormal   Collection Time: 09/15/23 12:20 PM  Result Value Ref Range   Color, UA yellow yellow   Clarity, UA clear clear   Glucose, UA negative negative mg/dL   Bilirubin, UA negative negative   Ketones, POC UA negative negative mg/dL   Spec Grav, UA 8.989 8.989 - 1.025   Blood, UA trace-lysed (A)  negative   pH, UA 7.0 5.0 - 8.0   POC PROTEIN,UA negative negative, trace   Urobilinogen, UA 0.2 0.2 or 1.0 E.U./dL   Nitrite, UA Negative Negative   Leukocytes, UA Negative Negative    Assessment and Plan :   PDMP not reviewed this encounter.  1. Lumbar strain, initial encounter   2. Urinary frequency   3. Acute midline low back pain without sciatica   4. Bilateral sciatica    Recommend an oral prednisone course for his bilateral sciatica secondary to his lumbar strain.  Use supportive care otherwise.  Maintain medications for his BPH.  Counseled patient on potential for adverse effects with medications prescribed/recommended today, ER and return-to-clinic precautions discussed, patient verbalized understanding.    Christopher Savannah, [place] 09/15/23 1325

## 2023-09-15 NOTE — ED Triage Notes (Addendum)
 Low back pain onset 8/28 when the Patient went to pull a weed in the yard. States also having some urinary frequency today. History of back pain. No recent falls or injuries that the Patient knows off.   Tried tylenol  and Back ease with moderate relief but was told not to take these to often as it could mess with his blood pressure medications.

## 2023-09-16 LAB — URINE CULTURE: Culture: NO GROWTH

## 2023-12-12 ENCOUNTER — Ambulatory Visit: Payer: 59 | Admitting: Urology

## 2023-12-12 VITALS — BP 144/81 | HR 73

## 2023-12-12 DIAGNOSIS — N401 Enlarged prostate with lower urinary tract symptoms: Secondary | ICD-10-CM | POA: Diagnosis not present

## 2023-12-12 DIAGNOSIS — R3129 Other microscopic hematuria: Secondary | ICD-10-CM | POA: Diagnosis not present

## 2023-12-12 DIAGNOSIS — N138 Other obstructive and reflux uropathy: Secondary | ICD-10-CM

## 2023-12-12 DIAGNOSIS — R351 Nocturia: Secondary | ICD-10-CM

## 2023-12-12 LAB — URINALYSIS, ROUTINE W REFLEX MICROSCOPIC
Bilirubin, UA: NEGATIVE
Glucose, UA: NEGATIVE
Ketones, UA: NEGATIVE
Leukocytes,UA: NEGATIVE
Nitrite, UA: NEGATIVE
Protein,UA: NEGATIVE
Specific Gravity, UA: 1.02 (ref 1.005–1.030)
Urobilinogen, Ur: 1 mg/dL (ref 0.2–1.0)
pH, UA: 6 (ref 5.0–7.5)

## 2023-12-12 LAB — MICROSCOPIC EXAMINATION
Bacteria, UA: NONE SEEN
WBC, UA: NONE SEEN /HPF (ref 0–5)

## 2023-12-12 MED ORDER — ALFUZOSIN HCL ER 10 MG PO TB24: 10.0000 mg | ORAL_TABLET | Freq: Every day | ORAL | 3 refills | Status: AC

## 2023-12-12 NOTE — Progress Notes (Signed)
 12/12/2023 2:04 PM   Marsha KATHEE Daring Oct 26, 1965 [id number]  Referring provider: Sheryle Carwin, MD [address] [place],  [place] [address]  Followup BPH   HPI: Mr Normoyle is a 58yo here for followup for BPH and microhematuria. UA shows 3-10 RBCs/hpf. IPSS 4 QOL 2. He takes uroxatral  every other day. Urine stream strong. No straining to urinate. Nocturia 2x. No post void dribbling. PSA was normal per the patient  PMH: Past Medical History:  Diagnosis Date   Allergic response, sequela    Eosinophilia    Hypertension    Right inguinal hernia     Surgical History: Past Surgical History:  Procedure Laterality Date   HERNIA REPAIR      Home Medications:  Allergies as of 12/12/2023   No Known Allergies      Medication List        Accurate as of December 12, 2023  2:04 PM. If you have any questions, ask your nurse or doctor.          alfuzosin  10 MG 24 hr tablet Commonly known as: UROXATRAL  Take 1 tablet (10 mg total) by mouth at bedtime.   amLODipine 5 MG tablet Commonly known as: NORVASC Take 5 mg by mouth daily.   cetirizine 10 MG tablet Commonly known as: ZYRTEC Take 10 mg by mouth daily.   cyclobenzaprine  5 MG tablet Commonly known as: FLEXERIL  Take 1 tablet (5 mg total) by mouth at bedtime as needed.   losartan 50 MG tablet Commonly known as: COZAAR Take 50 mg by mouth daily.   multivitamin with minerals tablet Take 1 tablet by mouth daily.        Allergies: No Known Allergies  Family History: Family History  Problem Relation Age of Onset   Rheum arthritis Mother        ?   Hypertension Mother    Diabetes Father     Social History:  reports that he has never smoked. He has never used smokeless tobacco. He reports that he does not drink alcohol and does not use drugs.  ROS: All other review of systems were reviewed and are negative except what is noted above in HPI  Physical Exam: BP (!) 144/81   Pulse 73   Constitutional:   Alert and oriented, No acute distress. HEENT: [place] AT, moist mucus membranes.  Trachea midline, no masses. Cardiovascular: No clubbing, cyanosis, or edema. Respiratory: Normal respiratory effort, no increased work of breathing. GI: Abdomen is soft, nontender, nondistended, no abdominal masses GU: No CVA tenderness.  Lymph: No cervical or inguinal lymphadenopathy. Skin: No rashes, bruises or suspicious lesions. Neurologic: Grossly intact, no focal deficits, moving all 4 extremities. Psychiatric: Normal mood and affect.  Laboratory Data: Lab Results  Component Value Date   WBC 11.0 (H) 12/06/2016   HGB 14.1 12/06/2016   HCT 42.1 12/06/2016   MCV 79.6 12/06/2016   PLT 186 12/06/2016    Lab Results  Component Value Date   CREATININE 0.90 05/02/2017    No results found for: PSA  No results found for: TESTOSTERONE  No results found for: HGBA1C  Urinalysis    Component Value Date/Time   COLORURINE YELLOW 04/20/2016 1136   APPEARANCEUR Clear 06/07/2022 1544   LABSPEC 1.018 04/20/2016 1136   PHURINE 6.0 04/20/2016 1136   GLUCOSEU Negative 06/07/2022 1544   HGBUR NEGATIVE 04/20/2016 1136   BILIRUBINUR negative 09/15/2023 1220   BILIRUBINUR Negative 06/07/2022 1544   KETONESUR negative 09/15/2023 1220   KETONESUR NEGATIVE  04/20/2016 1136   PROTEINUR Trace (A) 06/07/2022 1544   PROTEINUR NEGATIVE 04/20/2016 1136   UROBILINOGEN 0.2 09/15/2023 1220   NITRITE Negative 09/15/2023 1220   NITRITE Negative 06/07/2022 1544   NITRITE NEGATIVE 04/20/2016 1136   LEUKOCYTESUR Negative 09/15/2023 1220   LEUKOCYTESUR Negative 06/07/2022 1544    Lab Results  Component Value Date   LABMICR See below: 06/07/2022   WBCUA 0-5 06/07/2022   LABEPIT 0-10 06/07/2022   MUCUS Present 06/07/2021   BACTERIA None seen 06/07/2022    Pertinent Imaging:  No results found for this or any previous visit.  No results found for this or any previous visit.  No results found for this or any  previous visit.  No results found for this or any previous visit.  No results found for this or any previous visit.  No results found for this or any previous visit.  No results found for this or any previous visit.  Results for orders placed during the hospital encounter of 04/20/16  CT Renal Stone Study  Narrative CLINICAL DATA:  Patient with low back pain and nausea.  EXAM: CT ABDOMEN AND PELVIS WITHOUT CONTRAST  TECHNIQUE: Multidetector CT imaging of the abdomen and pelvis was performed following the standard protocol without IV contrast.  COMPARISON:  None.  FINDINGS: Lower chest: Lung bases are clear.  Normal heart size.  Hepatobiliary: Liver is normal in size and contour. Gallbladder is unremarkable.  Pancreas: Unremarkable  Spleen: Unremarkable  Adrenals/Urinary Tract: Normal adrenal glands. Kidneys are symmetric in size. No hydronephrosis. No nephroureterolithiasis. Urinary bladder is unremarkable.  Stomach/Bowel: No abnormal bowel wall thickening or evidence for bowel obstruction. No free fluid or free intraperitoneal air. Normal morphology of the stomach.  Vascular/Lymphatic: Normal caliber abdominal aorta. No retroperitoneal lymphadenopathy.  Reproductive: Prostate unremarkable.  Other: None.  Musculoskeletal: No aggressive or acute appearing osseous lesions. Lumbar spine degenerative changes.  IMPRESSION: No nephroureterolithiasis.  No hydronephrosis.  No acute process identified within the abdomen or pelvis.   Electronically Signed By: Bard Moats M.D. On: 04/20/2016 12:50   Assessment & Plan:    1. BPH with obstruction/lower urinary tract symptoms (Primary) Continue uroxatral  QOD - Urinalysis, Routine w reflex microscopic  2. Nocturia Continue uroxatral  QOD  3. Microhematuria Followup 1 year with a UA   No follow-ups on file.  Belvie Clara, MD  [facility] [place]

## 2023-12-18 ENCOUNTER — Encounter: Payer: Self-pay | Admitting: Urology

## 2023-12-18 NOTE — Patient Instructions (Signed)

## 2024-12-03 ENCOUNTER — Ambulatory Visit: Admitting: Urology
